# Patient Record
Sex: Male | Born: 1950 | Race: White | Hispanic: No | Smoking: Former smoker
Health system: Southern US, Community
[De-identification: ages and names within clinical notes are randomized; demographics above are authoritative.]

## PROBLEM LIST (undated history)

## (undated) DIAGNOSIS — I509 Heart failure, unspecified: Secondary | ICD-10-CM

## (undated) HISTORY — DX: Heart failure, unspecified: I50.9

---

## 2016-11-24 ENCOUNTER — Encounter: Payer: Self-pay | Admitting: Physician Assistant

## 2016-11-24 ENCOUNTER — Ambulatory Visit: Payer: Self-pay | Admitting: Physician Assistant

## 2016-11-24 VITALS — BP 160/99 | HR 110 | Temp 98.7°F | Resp 16

## 2016-11-24 DIAGNOSIS — L03115 Cellulitis of right lower limb: Secondary | ICD-10-CM

## 2016-11-24 DIAGNOSIS — Z299 Encounter for prophylactic measures, unspecified: Secondary | ICD-10-CM

## 2016-11-24 MED ORDER — CLINDAMYCIN HCL 300 MG PO CAPS: 300.0000 mg | ORAL_CAPSULE | Freq: Three times a day (TID) | ORAL | 0 refills | Status: DC

## 2016-11-24 NOTE — Progress Notes (Signed)
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

## 2016-11-24 NOTE — Addendum Note (Signed)
Addended by: Faythe GheeFISHER, SUSAN W on: 11/24/2016 02:59 PM   Modules accepted: Orders

## 2016-11-24 NOTE — Addendum Note (Signed)
Addended by: Catha BrowEACON, MONIQUE T on: 11/24/2016 03:05 PM   Modules accepted: Orders

## 2016-11-25 LAB — CMP12+LP+TP+TSH+6AC+PSA+CBC…
A/G RATIO: 1.5 (ref 1.2–2.2)
ALBUMIN: 4.7 g/dL (ref 3.6–4.8)
ALT: 13 IU/L (ref 0–44)
AST: 19 IU/L (ref 0–40)
Alkaline Phosphatase: 76 IU/L (ref 39–117)
BASOS: 0 %
BUN/Creatinine Ratio: 12 (ref 10–24)
BUN: 11 mg/dL (ref 8–27)
Basophils Absolute: 0 10*3/uL (ref 0.0–0.2)
Bilirubin Total: 0.4 mg/dL (ref 0.0–1.2)
CALCIUM: 9.4 mg/dL (ref 8.6–10.2)
CHOL/HDL RATIO: 4.3 ratio (ref 0.0–5.0)
CHOLESTEROL TOTAL: 187 mg/dL (ref 100–199)
Chloride: 101 mmol/L (ref 96–106)
Creatinine, Ser: 0.95 mg/dL (ref 0.76–1.27)
EOS (ABSOLUTE): 0.1 10*3/uL (ref 0.0–0.4)
Eos: 1 %
Estimated CHD Risk: 0.8 times avg. (ref 0.0–1.0)
Free Thyroxine Index: 2.1 (ref 1.2–4.9)
GFR calc Af Amer: 95 mL/min/{1.73_m2} (ref >59)
GFR, EST NON AFRICAN AMERICAN: 82 mL/min/{1.73_m2} (ref >59)
GGT: 18 IU/L (ref 0–65)
Globulin, Total: 3.1 g/dL (ref 1.5–4.5)
Glucose: 147 mg/dL — ABNORMAL HIGH (ref 65–99)
HDL: 44 mg/dL (ref >39)
Hematocrit: 44.6 % (ref 37.5–51.0)
Hemoglobin: 15.1 g/dL (ref 13.0–17.7)
IMMATURE GRANULOCYTES: 0 %
IRON: 92 ug/dL (ref 38–169)
Immature Grans (Abs): 0 10*3/uL (ref 0.0–0.1)
LDH: 186 IU/L (ref 121–224)
LDL Calculated: 99 mg/dL (ref 0–99)
LYMPHS ABS: 2.3 10*3/uL (ref 0.7–3.1)
Lymphs: 22 %
MCH: 31.1 pg (ref 26.6–33.0)
MCHC: 33.9 g/dL (ref 31.5–35.7)
MCV: 92 fL (ref 79–97)
MONOS ABS: 0.8 10*3/uL (ref 0.1–0.9)
Monocytes: 7 %
NEUTROS PCT: 70 %
Neutrophils Absolute: 7.4 10*3/uL — ABNORMAL HIGH (ref 1.4–7.0)
PHOSPHORUS: 3.7 mg/dL (ref 2.5–4.5)
POTASSIUM: 5.2 mmol/L (ref 3.5–5.2)
PROSTATE SPECIFIC AG, SERUM: 2.9 ng/mL (ref 0.0–4.0)
Platelets: 269 10*3/uL (ref 150–379)
RBC: 4.86 x10E6/uL (ref 4.14–5.80)
RDW: 12.9 % (ref 12.3–15.4)
SODIUM: 143 mmol/L (ref 134–144)
T3 UPTAKE RATIO: 27 % (ref 24–39)
T4 TOTAL: 7.6 ug/dL (ref 4.5–12.0)
TOTAL PROTEIN: 7.8 g/dL (ref 6.0–8.5)
TRIGLYCERIDES: 221 mg/dL — AB (ref 0–149)
TSH: 0.959 u[IU]/mL (ref 0.450–4.500)
Uric Acid: 6.5 mg/dL (ref 3.7–8.6)
VLDL Cholesterol Cal: 44 mg/dL — ABNORMAL HIGH (ref 5–40)
WBC: 10.6 10*3/uL (ref 3.4–10.8)

## 2016-11-25 LAB — HGB A1C W/O EAG: Hgb A1c MFr Bld: 6.8 % — ABNORMAL HIGH (ref 4.8–5.6)

## 2016-11-26 ENCOUNTER — Encounter: Payer: Self-pay | Admitting: Physician Assistant

## 2016-11-26 ENCOUNTER — Ambulatory Visit: Payer: Self-pay | Admitting: Physician Assistant

## 2016-11-26 VITALS — BP 162/100 | HR 86 | Temp 98.3°F | Ht 70.0 in | Wt 202.0 lb

## 2016-11-26 DIAGNOSIS — L03115 Cellulitis of right lower limb: Secondary | ICD-10-CM

## 2016-11-26 NOTE — Progress Notes (Signed)
S: here for recheck of cellulitis and bp, bp is still elevated, labs drawn the other day show an elevated A1C of 6.8 with high triglycerides, pt states he has not had fever/chills, area is not as red  O: vitals w elevated bp, skin on r foot has decreased redness and has receded inside the marking from 2 days ago, n/v intact  A: cellulitis, prediabetes, elevated bp without dx of htn  P: continue antibiotic, prediabetes packet given with discussion of diet and exercise which would help the A1C and bp; pt wants to try to lower the A1C and bp by diet and exercise at this time, will add cinnamon pills to help naturally lower bp, will return on Tues 7/31 for recheck

## 2016-12-01 ENCOUNTER — Ambulatory Visit: Payer: Self-pay | Admitting: Physician Assistant

## 2016-12-01 ENCOUNTER — Encounter: Payer: Self-pay | Admitting: Physician Assistant

## 2016-12-01 VITALS — BP 170/100 | HR 78 | Temp 97.2°F | Resp 16

## 2016-12-01 DIAGNOSIS — L03115 Cellulitis of right lower limb: Secondary | ICD-10-CM

## 2016-12-01 DIAGNOSIS — I10 Essential (primary) hypertension: Secondary | ICD-10-CM

## 2016-12-01 MED ORDER — CLINDAMYCIN HCL 300 MG PO CAPS: 300.0000 mg | ORAL_CAPSULE | Freq: Three times a day (TID) | ORAL | 0 refills | Status: DC

## 2016-12-01 MED ORDER — LISINOPRIL 20 MG PO TABS: 20.0000 mg | ORAL_TABLET | Freq: Every day | ORAL | 3 refills | Status: DC

## 2016-12-01 NOTE — Progress Notes (Signed)
S: here for recheck of cellulitis, states the foot is a little swollen but feeling much better, no fever/chills, no cp/sob, bp is still elevated, +smoker  O: vitals wnl, nad, r foot is a little swollen. Decreased redness, no pustule or vesicles no drainage, n/v intact  A: htn, cellulitis  P: lisinopril 20mg  qd, clinda 300mg  tid x 5d, recheck bp in 1week

## 2017-05-04 DIAGNOSIS — E119 Type 2 diabetes mellitus without complications: Secondary | ICD-10-CM

## 2017-05-04 DIAGNOSIS — I10 Essential (primary) hypertension: Secondary | ICD-10-CM

## 2017-05-04 HISTORY — DX: Type 2 diabetes mellitus without complications: E11.9

## 2017-05-04 HISTORY — DX: Essential (primary) hypertension: I10

## 2017-12-29 ENCOUNTER — Telehealth: Payer: Self-pay

## 2017-12-29 NOTE — Telephone Encounter (Signed)
I am happy to see him in the clinic but I will not take the place of a primary care provider, as this is an acute care clinic. Chronic conditions and medications, such as hypertension and medications used to treat it, should be monitored by and refills provided by a primary care provider. He is due for blood work to monitor this medication that is supposed to be ordered by a primary care provider but I can order this for him in the meantime if he comes in for a wellness visit with me and I will provide him with two refills following that if appropriate, but future refills and further monitoring need to come from a primary care provider. The purpose of this wellness visit is to address the refill he needs and not to take the place of a wellness exam with a primary care provider. Let me know though if you find out that he is going to run out of his medication prior to that wellness appointment, I want to make sure he doesn't miss any doses. Make sure he knows to go ahead an make an apt with a new primary care provider ASAP to establish care.

## 2017-12-29 NOTE — Telephone Encounter (Signed)
Spoke with patient. Pt states he "has never been to any other doctor", and has not monitored his blood pressure since OV 11/24/16, 11/26/16, and 12/01/16. Pt states "not interested in getting a PCP." I expressed that we want long term medications monitored by a PCP for better monitoring of his health or if there ever needs to be a dosage change. Explained we are an acute care clinic to go along side of a primary care clinic, not take the place of a PCP. Answered all questions. Pt scheduled a visit for 01/04/18 at 11:30am to talk to provider and get resources for a PCP accepting new patients. Pt expressed understanding that we would only do one refill until he schedules that appointment with a PCP.

## 2017-12-29 NOTE — Telephone Encounter (Signed)
Please advise the pharmacy to forward the refill request to the patient's primary care provider. If this patient does not have a primary care provider, advise the patient to make an appointment with one as soon as possible to be seen within the next month and I will provide him with one refill (one month's worth).

## 2017-12-29 NOTE — Telephone Encounter (Signed)
Received fax requesting refill for Lisinopril 20 mg tab.  Last OV- 12/01/2016 Last Lab- 11/24/2016

## 2017-12-30 NOTE — Telephone Encounter (Signed)
Pt. Stated 01/04/18 was the only day he could come since he will be going out of town after that and that he still had a weeks worth of his medication. Pt is aware to go ahead an make an appt with a PCP ASAP to establish care, pt hesitant on doing this. Unsure if he will get PCP due to previous phone call. Pt aware of clinics intention is for acute visits and Biometric screenings and the associated wellness check with it. Pt aware chronic conditions should be done by a PCP. Pt gave verbal understanding.   Last call he stated he would call back about whether he wanted to keep that 01/04/18 appt, and request refill be sent in. I'm assuming he wants the refill now, but he did not specify.

## 2018-01-04 ENCOUNTER — Other Ambulatory Visit: Payer: Self-pay | Admitting: Family Medicine

## 2018-01-04 ENCOUNTER — Ambulatory Visit: Payer: Self-pay

## 2018-01-04 DIAGNOSIS — I10 Essential (primary) hypertension: Secondary | ICD-10-CM

## 2018-01-04 MED ORDER — LISINOPRIL 20 MG PO TABS: 20.0000 mg | ORAL_TABLET | Freq: Every day | ORAL | 0 refills | Status: DC

## 2018-01-04 NOTE — Progress Notes (Signed)
Patient did not show for scheduled appointment for monitoring his hypertension and blood pressure medication.  Providing patient with 1 refill so that he does not run out.  Patient aware that he needs to get blood work done necessary to monitor this medication asap and be seen in our office or by PCP to properly monitor this medication and provide future refills.  Patient aware that he is overdue for a wellness visit with a primary care provider and that he needs to schedule this ASAP for additional blood work outside of monitoring his BP medication.

## 2018-01-07 MED ORDER — LISINOPRIL 20 MG PO TABS: 20.0000 mg | ORAL_TABLET | Freq: Every day | ORAL | 0 refills | Status: DC

## 2018-01-07 NOTE — Progress Notes (Signed)
Redirected refill for lisinopril to a different pharmacy per patient request.

## 2018-02-03 ENCOUNTER — Other Ambulatory Visit: Payer: Self-pay

## 2018-02-03 DIAGNOSIS — I10 Essential (primary) hypertension: Secondary | ICD-10-CM

## 2018-02-03 NOTE — Progress Notes (Signed)
BP 138/90

## 2018-02-04 ENCOUNTER — Other Ambulatory Visit: Payer: Self-pay | Admitting: Family Medicine

## 2018-02-04 DIAGNOSIS — I10 Essential (primary) hypertension: Secondary | ICD-10-CM

## 2018-02-04 LAB — COMPREHENSIVE METABOLIC PANEL
ALBUMIN: 4.7 g/dL (ref 3.6–4.8)
ALK PHOS: 67 IU/L (ref 39–117)
ALT: 29 IU/L (ref 0–44)
AST: 24 IU/L (ref 0–40)
Albumin/Globulin Ratio: 1.7 (ref 1.2–2.2)
BILIRUBIN TOTAL: 0.5 mg/dL (ref 0.0–1.2)
BUN / CREAT RATIO: 11 (ref 10–24)
BUN: 10 mg/dL (ref 8–27)
CHLORIDE: 100 mmol/L (ref 96–106)
CO2: 18 mmol/L — AB (ref 20–29)
CREATININE: 0.89 mg/dL (ref 0.76–1.27)
Calcium: 9.6 mg/dL (ref 8.6–10.2)
GFR calc Af Amer: 101 mL/min/{1.73_m2} (ref >59)
GFR calc non Af Amer: 87 mL/min/{1.73_m2} (ref >59)
GLUCOSE: 143 mg/dL — AB (ref 65–99)
Globulin, Total: 2.7 g/dL (ref 1.5–4.5)
Potassium: 4.4 mmol/L (ref 3.5–5.2)
Sodium: 136 mmol/L (ref 134–144)
TOTAL PROTEIN: 7.4 g/dL (ref 6.0–8.5)

## 2018-02-04 MED ORDER — LISINOPRIL 20 MG PO TABS: 20.0000 mg | ORAL_TABLET | Freq: Every day | ORAL | 0 refills | Status: DC

## 2018-02-04 NOTE — Progress Notes (Signed)
Cole Collins,  Will you call [name] and inform him that his lab results are normal?  Let him know that I will send in one more refill into his pharmacy for his blood pressure medication.  Make sure to remind him to see a primary care provider within the next month to obtain future refills.  His glucose is elevated because he was not fasting.

## 2018-02-04 NOTE — Progress Notes (Signed)
Patient requesting another refill for his lisinopril, since he has been unable to get into see a primary care provider.  Patient unable to see me in the office due to his personal time constraints but was able to stop in for lab work and a blood pressure check.  Blood pressure on 02/03/2018 was 138/90.  Patient denies side/adverse effects, symptoms, or concerns.  CMP normal.  Glucose nonfasting.  Patient reminded that future refills/monitoring are the responsibility of his primary care provider and not our acute care clinic.

## 2018-03-02 ENCOUNTER — Other Ambulatory Visit: Payer: Self-pay | Admitting: Family Medicine

## 2018-03-02 DIAGNOSIS — I10 Essential (primary) hypertension: Secondary | ICD-10-CM

## 2020-04-08 ENCOUNTER — Emergency Department: Payer: Medicare HMO

## 2020-04-08 ENCOUNTER — Inpatient Hospital Stay
Admission: EM | Admit: 2020-04-08 | Discharge: 2020-04-11 | DRG: 291 | Disposition: A | Discharge status: Home / Self Care | Payer: Medicare HMO | Attending: Obstetrics and Gynecology | Admitting: Obstetrics and Gynecology

## 2020-04-08 ENCOUNTER — Other Ambulatory Visit: Payer: Self-pay

## 2020-04-08 DIAGNOSIS — Z7984 Long term (current) use of oral hypoglycemic drugs: Secondary | ICD-10-CM

## 2020-04-08 DIAGNOSIS — I4891 Unspecified atrial fibrillation: Secondary | ICD-10-CM | POA: Diagnosis present

## 2020-04-08 DIAGNOSIS — K5900 Constipation, unspecified: Secondary | ICD-10-CM | POA: Diagnosis present

## 2020-04-08 DIAGNOSIS — R778 Other specified abnormalities of plasma proteins: Secondary | ICD-10-CM | POA: Diagnosis not present

## 2020-04-08 DIAGNOSIS — E876 Hypokalemia: Secondary | ICD-10-CM

## 2020-04-08 DIAGNOSIS — I5041 Acute combined systolic (congestive) and diastolic (congestive) heart failure: Secondary | ICD-10-CM | POA: Diagnosis present

## 2020-04-08 DIAGNOSIS — I5031 Acute diastolic (congestive) heart failure: Secondary | ICD-10-CM | POA: Diagnosis not present

## 2020-04-08 DIAGNOSIS — I509 Heart failure, unspecified: Secondary | ICD-10-CM

## 2020-04-08 DIAGNOSIS — I248 Other forms of acute ischemic heart disease: Secondary | ICD-10-CM | POA: Diagnosis present

## 2020-04-08 DIAGNOSIS — I214 Non-ST elevation (NSTEMI) myocardial infarction: Secondary | ICD-10-CM

## 2020-04-08 DIAGNOSIS — Z20822 Contact with and (suspected) exposure to covid-19: Secondary | ICD-10-CM | POA: Diagnosis present

## 2020-04-08 DIAGNOSIS — Z79899 Other long term (current) drug therapy: Secondary | ICD-10-CM

## 2020-04-08 DIAGNOSIS — Z807 Family history of other malignant neoplasms of lymphoid, hematopoietic and related tissues: Secondary | ICD-10-CM | POA: Diagnosis not present

## 2020-04-08 DIAGNOSIS — E1165 Type 2 diabetes mellitus with hyperglycemia: Secondary | ICD-10-CM | POA: Diagnosis present

## 2020-04-08 DIAGNOSIS — R0602 Shortness of breath: Secondary | ICD-10-CM | POA: Diagnosis present

## 2020-04-08 DIAGNOSIS — F17210 Nicotine dependence, cigarettes, uncomplicated: Secondary | ICD-10-CM | POA: Diagnosis present

## 2020-04-08 DIAGNOSIS — I110 Hypertensive heart disease with heart failure: Principal | ICD-10-CM | POA: Diagnosis present

## 2020-04-08 DIAGNOSIS — I5021 Acute systolic (congestive) heart failure: Secondary | ICD-10-CM | POA: Diagnosis not present

## 2020-04-08 LAB — COMPREHENSIVE METABOLIC PANEL
ALT: 23 U/L (ref 0–44)
AST: 21 U/L (ref 15–41)
Albumin: 3.3 g/dL — ABNORMAL LOW (ref 3.5–5.0)
Alkaline Phosphatase: 61 U/L (ref 38–126)
Anion gap: 13 (ref 5–15)
BUN: 10 mg/dL (ref 8–23)
CO2: 22 mmol/L (ref 22–32)
Calcium: 8.6 mg/dL — ABNORMAL LOW (ref 8.9–10.3)
Chloride: 103 mmol/L (ref 98–111)
Creatinine, Ser: 0.89 mg/dL (ref 0.61–1.24)
GFR, Estimated: >60 mL/min (ref >60)
Glucose, Bld: 211 mg/dL — ABNORMAL HIGH (ref 70–99)
Potassium: 3.4 mmol/L — ABNORMAL LOW (ref 3.5–5.1)
Sodium: 138 mmol/L (ref 135–145)
Total Bilirubin: 0.8 mg/dL (ref 0.3–1.2)
Total Protein: 7.4 g/dL (ref 6.5–8.1)

## 2020-04-08 LAB — CBC
HCT: 41.4 % (ref 39.0–52.0)
Hemoglobin: 13.9 g/dL (ref 13.0–17.0)
MCH: 30.8 pg (ref 26.0–34.0)
MCHC: 33.6 g/dL (ref 30.0–36.0)
MCV: 91.8 fL (ref 80.0–100.0)
Platelets: 386 10*3/uL (ref 150–400)
RBC: 4.51 MIL/uL (ref 4.22–5.81)
RDW: 12.3 % (ref 11.5–15.5)
WBC: 11.3 10*3/uL — ABNORMAL HIGH (ref 4.0–10.5)
nRBC: 0 % (ref 0.0–0.2)

## 2020-04-08 LAB — APTT: aPTT: 36 seconds (ref 24–36)

## 2020-04-08 LAB — RESP PANEL BY RT-PCR (FLU A&B, COVID) ARPGX2
Influenza A by PCR: NEGATIVE
Influenza B by PCR: NEGATIVE
SARS Coronavirus 2 by RT PCR: NEGATIVE

## 2020-04-08 LAB — TROPONIN I (HIGH SENSITIVITY)
Troponin I (High Sensitivity): 126 ng/L (ref <18)
Troponin I (High Sensitivity): 134 ng/L (ref <18)

## 2020-04-08 LAB — PROTIME-INR
INR: 1.1 (ref 0.8–1.2)
Prothrombin Time: 14 seconds (ref 11.4–15.2)

## 2020-04-08 MED ORDER — HEPARIN (PORCINE) 25000 UT/250ML-% IV SOLN
1350.0000 [IU]/h | INTRAVENOUS | Status: DC
  Administered 2020-04-08: 1150 [IU]/h via INTRAVENOUS
  Filled 2020-04-08: qty 250

## 2020-04-08 MED ORDER — HEPARIN SODIUM (PORCINE) 5000 UNIT/ML IJ SOLN
INTRAMUSCULAR | Status: AC
  Filled 2020-04-08: qty 1

## 2020-04-08 MED ORDER — FUROSEMIDE 10 MG/ML IJ SOLN
20.0000 mg | Freq: Once | INTRAMUSCULAR | Status: AC
  Administered 2020-04-08: 20 mg via INTRAVENOUS
  Filled 2020-04-08: qty 4

## 2020-04-08 MED ORDER — HEPARIN BOLUS VIA INFUSION
4000.0000 [IU] | Freq: Once | INTRAVENOUS | Status: AC
  Administered 2020-04-08: 4000 [IU] via INTRAVENOUS
  Filled 2020-04-08: qty 4000

## 2020-04-08 NOTE — ED Triage Notes (Signed)
Pt in with co shob for over a week, saw pmd today and was sent here. States shob on exertion unable to sleep, no hx of the same. Pt denies any pain.

## 2020-04-08 NOTE — ED Notes (Signed)
Admitting MD at bedside.

## 2020-04-08 NOTE — ED Provider Notes (Signed)
[facility] Emergency Department Provider Note  ____________________________________________  Time seen: Approximately 11:26 PM  I have reviewed the triage vital signs and the nursing notes.   HISTORY  Chief Complaint Shortness of Breath    HPI Cole Collins is a 69 y.o. male with a past history of hypertension and diabetes who comes ED complaining of dyspnea on exertion which has been recurrent over the past 2 weeks, gradual onset, worsening, better with rest.  No chest pain.  Associated with orthopnea.  Currently at rest he has mild shortness of breath .     Past medical history hypertension and diabetes   Patient Active Problem List   Diagnosis Date Noted  . Acute CHF (congestive heart failure) (HCC) 04/08/2020        Prior to Admission medications   Medication Sig Start Date End Date Taking? Authorizing Provider  clindamycin (CLEOCIN) 300 MG capsule Take 1 capsule (300 mg total) by mouth 3 (three) times daily. 12/01/16   Fisher, Roselyn Bering, PA-C  lisinopril (PRINIVIL,ZESTRIL) 20 MG tablet Take 1 tablet (20 mg total) by mouth daily. 02/04/18   Ralene Muskrat, FNP     Allergies Patient has no known allergies.   No family history on file.  Social History Social History   Tobacco Use  . Smoking status: Current Every Day Smoker  . Smokeless tobacco: Never Used  Substance Use Topics  . Alcohol use: Not on file  . Drug use: Not on file    Review of Systems  Constitutional:   No fever or chills.  ENT:   No sore throat. No rhinorrhea. Cardiovascular:   No chest pain or syncope. Respiratory: Positive shortness of breath without cough. Gastrointestinal:   Negative for abdominal pain, vomiting and diarrhea.  Musculoskeletal:   Negative for focal pain or swelling All other systems reviewed and are negative except as documented above in ROS and HPI.  ____________________________________________   PHYSICAL EXAM:  VITAL  SIGNS: ED Triage Vitals  Enc Vitals Group     BP 04/08/20 1920 (!) 155/95     Pulse Rate 04/08/20 1920 (!) 116     Resp 04/08/20 1920 20     Temp 04/08/20 2025 97.9 F (36.6 C)     Temp Source 04/08/20 2025 Oral     SpO2 04/08/20 1920 95 %     Weight 04/08/20 1920 209 lb (94.8 kg)     Height 04/08/20 1920 5\' 10"  (1.778 m)     Head Circumference --      Peak Flow --      Pain Score 04/08/20 1920 0     Pain Loc --      Pain Edu? --      Excl. in GC? --     Vital signs reviewed, nursing assessments reviewed.   Constitutional:   Alert and oriented. Non-toxic appearance. Eyes:   Conjunctivae are normal. EOMI. PERRL. ENT      Head:   Normocephalic and atraumatic.      Nose:   Wearing a mask.      Mouth/Throat:   Wearing a mask.      Neck:   No meningismus. Full ROM. Hematological/Lymphatic/Immunilogical:   No cervical lymphadenopathy. Cardiovascular:   Tachycardia heart rate 110. Symmetric bilateral radial and DP pulses.  No murmurs. Cap refill less than 2 seconds. Respiratory:   Normal respiratory effort without tachypnea/retractions.  Bilateral basilar crackles Gastrointestinal:   Soft and nontender. Non distended. There is no CVA tenderness.  No rebound, rigidity, or guarding.  Musculoskeletal:   Normal range of motion in all extremities. No joint effusions.  No lower extremity tenderness.  No edema. Neurologic:   Normal speech and language.  Motor grossly intact. No acute focal neurologic deficits are appreciated.  Skin:    Skin is warm, dry and intact.  Scattered papular rash over bilateral lower legs.  No petechiae, purpura, or bullae.  ____________________________________________    LABS (pertinent positives/negatives) (all labs ordered are listed, but only abnormal results are displayed) Labs Reviewed  CBC - Abnormal; Notable for the following components:      Result Value   WBC 11.3 (*)    All other components within normal limits  COMPREHENSIVE METABOLIC PANEL  - Abnormal; Notable for the following components:   Potassium 3.4 (*)    Glucose, Bld 211 (*)    Calcium 8.6 (*)    Albumin 3.3 (*)    All other components within normal limits  TROPONIN I (HIGH SENSITIVITY) - Abnormal; Notable for the following components:   Troponin I (High Sensitivity) 126 (*)    All other components within normal limits  TROPONIN I (HIGH SENSITIVITY) - Abnormal; Notable for the following components:   Troponin I (High Sensitivity) 134 (*)    All other components within normal limits  RESP PANEL BY RT-PCR (FLU A&B, COVID) ARPGX2  APTT  PROTIME-INR  HEPARIN LEVEL (UNFRACTIONATED)  CBC   ____________________________________________   EKG  Interpreted by me Atrial fibrillation, rate of 117, left axis, normal intervals.  Poor R wave progression.  Normal ST segments and T waves, no acute ischemic changes.  ____________________________________________    RADIOLOGY  DG Chest 2 View  Result Date: 04/08/2020 CLINICAL DATA:  Shortness of breath. EXAM: CHEST - 2 VIEW COMPARISON:  None. FINDINGS: Moderate severity diffusely increased interstitial lung markings are seen with mild prominence of the perihilar pulmonary vasculature. Mild to moderate severity atelectasis and/or infiltrate is seen within the left lung base. Small bilateral pleural effusions are seen. No pneumothorax is identified. The heart size and mediastinal contours are within normal limits. The visualized skeletal structures are unremarkable. IMPRESSION: 1. Moderate severity interstitial edema with mild to moderate severity left basilar atelectasis and/or infiltrate. 2. Small bilateral pleural effusions. Electronically Signed   By: Aram Candela M.D.   On: 04/08/2020 19:52    ____________________________________________   PROCEDURES .Critical Care Performed by: Sharman Cheek, MD Authorized by: Sharman Cheek, MD   Critical care provider statement:    Critical care time (minutes):  35    Critical care time was exclusive of:  Separately billable procedures and treating other patients   Critical care was necessary to treat or prevent imminent or life-threatening deterioration of the following conditions:  Cardiac failure   Critical care was time spent personally by me on the following activities:  Development of treatment plan with patient or surrogate, discussions with consultants, evaluation of patient's response to treatment, examination of patient, obtaining history from patient or surrogate, ordering and performing treatments and interventions, ordering and review of laboratory studies, ordering and review of radiographic studies, pulse oximetry, re-evaluation of patient's condition and review of old charts    ____________________________________________  DIFFERENTIAL DIAGNOSIS   NSTEMI, new onset CHF, pneumonia, pleural effusion, pneumothorax  CLINICAL IMPRESSION / ASSESSMENT AND PLAN / ED COURSE  Medications ordered in the ED: Medications  heparin ADULT infusion 100 units/mL (25000 units/267mL sodium chloride 0.45%) (1,150 Units/hr Intravenous New Bag/Given 04/08/20 2135)  heparin 5000 UNIT/ML injection (  Not Given 04/08/20 2136)  furosemide (LASIX) injection 20 mg (20 mg Intravenous Given 04/08/20 2132)  heparin bolus via infusion 4,000 Units (4,000 Units Intravenous Bolus from Bag 04/08/20 2135)    Pertinent labs & imaging results that were available during my care of the patient were reviewed by me and considered in my medical decision making (see chart for details).  Cole Collins was evaluated in Emergency Department on 04/08/2020 for the symptoms described in the history of present illness. He was evaluated in the context of the global COVID-19 pandemic, which necessitated consideration that the patient might be at risk for infection with the SARS-CoV-2 virus that causes COVID-19. Institutional protocols and algorithms that pertain to the evaluation of patients at  risk for COVID-19 are in a state of rapid change based on information released by regulatory bodies including the CDC and federal and state organizations. These policies and algorithms were followed during the patient's care in the ED.   Patient presents with dyspnea on exertion which is worsening over the past 2 weeks, associate with orthopnea.  Associated with the finding of new onset of atrial fibrillation as well.  Given his exertional symptoms which are worsening, will start on heparin for possible unstable angina.  Initial troponin is elevated at 126.  Will give IV Lasix for initial diuresis and admit for further cardiac work-up.  Patient does not have an established cardiologist.      ____________________________________________   FINAL CLINICAL IMPRESSION(S) / ED DIAGNOSES    Final diagnoses:  NSTEMI (non-ST elevated myocardial infarction) (HCC)  Acute congestive heart failure, unspecified heart failure type [facility])  New onset atrial fibrillation [facility])     ED Discharge Orders    None      Portions of this note were generated with dragon dictation software. Dictation errors may occur despite best attempts at proofreading.   Sharman Cheek, MD 04/08/20 [phone]

## 2020-04-08 NOTE — ED Notes (Signed)
Charge nurse notified of troponin results; acuity level changed

## 2020-04-08 NOTE — H&P (Signed)
[name]   PATIENT NAME: Cole Collins    MR#:  [id number]  DATE OF BIRTH:  01/26/1951  DATE OF ADMISSION:  04/08/2020  PRIMARY CARE PHYSICIAN: Gracelyn Nurse, MD   REQUESTING/REFERRING PHYSICIAN: Alfonse Flavors, MD CHIEF COMPLAINT:   Chief Complaint  Patient presents with  . Shortness of Breath    HISTORY OF PRESENT ILLNESS:  Cole Collins  is a 69 y.o. Caucasian male with a known history of type 2 diabetes mellitus and hypertension as well as tobacco abuse, presented to the emergency room with acute onset of dyspnea with associated orthopnea and paroxysmal nocturnal dyspnea, worsening lower extremity edema as well as dyspnea on exertion which have been worsening over the last 10 days.  He denies any cough or wheezing.  No chest pain or palpitations.  No headache or dizziness or blurred vision.  Upon presentation to the emergency room, blood pressure was 155/95 with a heart rate of 116 with otherwise normal vital signs.  Labs revealed mild hypokalemia of 3.4 and hyperglycemia of 211 and albumin 3.3.  High-sensitivity troponin I was 126 and later 134.  CBC showed no leukocytosis 11.3.  Influenza antigens and COVID-19 PCR came back negative.EKG showed accelerated junctional rhythm with occasional PVCs, left axis deviation and T wave inversion laterally.  Chest x-ray showed moderate severity interstitial edema with mild to moderate severity left basal atelectasis and/or infiltrate with small bilateral pleural effusions.  The patient was given 1 mL of IV Lasix and IV heparin bolus and drip.  He will be admitted to a telemetry bed for further evaluation and management. PAST MEDICAL HISTORY:  Diabetes mellitus, hypertension and tobacco abuse.  PAST SURGICAL HISTORY:  He denies any previous surgeries.  SOCIAL HISTORY:   Social History   Tobacco Use  . Smoking status: Current Every Day Smoker  . Smokeless tobacco: Never Used  Substance Use Topics  . Alcohol use: Not on  file  He quit smoking cigarettes 3 days ago per his report.  FAMILY HISTORY:  Positive for lymphoma in his father.  DRUG ALLERGIES:  No Known Allergies  REVIEW OF SYSTEMS:   ROS As per history of present illness. All pertinent systems were reviewed above. Constitutional, HEENT, cardiovascular, respiratory, GI, GU, musculoskeletal, neuro, psychiatric, endocrine, integumentary and hematologic systems were reviewed and are otherwise negative/unremarkable except for positive findings mentioned above in the HPI.   MEDICATIONS AT HOME:   Prior to Admission medications   Medication Sig Start Date End Date Taking? Authorizing Provider  clindamycin (CLEOCIN) 300 MG capsule Take 1 capsule (300 mg total) by mouth 3 (three) times daily. 12/01/16   Fisher, Roselyn Bering, PA-C  lisinopril (PRINIVIL,ZESTRIL) 20 MG tablet Take 1 tablet (20 mg total) by mouth daily. 02/04/18   McManama, Richardson Dopp, FNP      VITAL SIGNS:  Blood pressure (!) 148/100, pulse (!) 109, temperature 97.9 F (36.6 C), temperature source Oral, resp. rate (!) 36, height 5\' 10"  (1.778 m), weight 94.8 kg, SpO2 94 %.  PHYSICAL EXAMINATION:  Physical Exam  GENERAL:  69 y.o.-year-old Caucasian male patient lying in the bed with mild respiratory distress and conversational dyspnea.  EYES: Pupils equal, round, reactive to light and accommodation. No scleral icterus. Extraocular muscles intact.  HEENT: Head atraumatic, normocephalic. Oropharynx and nasopharynx clear.  NECK:  Supple, no jugular venous distention. No thyroid enlargement, no tenderness.  LUNGS: Diminished bibasal breath sounds with bibasilar rales.62   CARDIOVASCULAR: Regular rate and rhythm, S1, S2 normal.  No murmurs, rubs, or gallops.  ABDOMEN: Soft, nondistended, nontender. Bowel sounds present. No organomegaly or mass.  EXTREMITIES: 1+ bilateral lower extremity pitting edema with no clubbing or cyanosis.  NEUROLOGIC: Cranial nerves II through XII are intact. Muscle  strength 5/5 in all extremities. Sensation intact. Gait not checked.  PSYCHIATRIC: The patient is alert and oriented x 3.  Normal affect and good eye contact. SKIN: No obvious rash, lesion, or ulcer.   LABORATORY PANEL:   CBC Recent Labs  Lab 04/08/20 1926  WBC 11.3*  HGB 13.9  HCT 41.4  PLT 386   ------------------------------------------------------------------------------------------------------------------  Chemistries  Recent Labs  Lab 04/08/20 1926  NA 138  K 3.4*  CL 103  CO2 22  GLUCOSE 211*  BUN 10  CREATININE 0.89  CALCIUM 8.6*  AST 21  ALT 23  ALKPHOS 61  BILITOT 0.8   ------------------------------------------------------------------------------------------------------------------  Cardiac Enzymes No results for input(s): TROPONINI in the last 168 hours. ------------------------------------------------------------------------------------------------------------------  RADIOLOGY:  DG Chest 2 View  Result Date: 04/08/2020 CLINICAL DATA:  Shortness of breath. EXAM: CHEST - 2 VIEW COMPARISON:  None. FINDINGS: Moderate severity diffusely increased interstitial lung markings are seen with mild prominence of the perihilar pulmonary vasculature. Mild to moderate severity atelectasis and/or infiltrate is seen within the left lung base. Small bilateral pleural effusions are seen. No pneumothorax is identified. The heart size and mediastinal contours are within normal limits. The visualized skeletal structures are unremarkable. IMPRESSION: 1. Moderate severity interstitial edema with mild to moderate severity left basilar atelectasis and/or infiltrate. 2. Small bilateral pleural effusions. Electronically Signed   By: Aram Candela M.D.   On: 04/08/2020 19:52      IMPRESSION AND PLAN:   1.  New onset acute CHF likely diastolic. -The patient will be admitted to a progressive unit bed. -She will be diuresed with IV Lasix. -We will follow serial troponin  I's. -2D echo and cardiology consult will be obtained. -I notified Dr. Juliann Pares about the patient.  2.  Hypokalemia. -We will replace potassium and check magnesium level.  3.  Elevated troponin I. -This could be related to demand ischemia and he could be having acute coronary syndrome/non-STEMI. -We will continue him on IV heparin. -Obtain 2D echo and cardiology consult as mentioned above. -The patient will be placed on high-dose statin and aspirin.  He was chest pain-free.  4.  Type 2 diabetes mellitus. -The patient will be placed on supplemental coverage with NovoLog. -We will continue his glipizide and hold off his Metformin.  5.  Essential hypertension. -We will continue has Zestril and HCTZ.  6.  DVT prophylaxis. -The patient will be on IV heparin.  All the records are reviewed and case discussed with ED provider. The plan of care was discussed in details with the patient (and family). I answered all questions. The patient agreed to proceed with the above mentioned plan. Further management will depend upon hospital course.   CODE STATUS: Full code  Status is: Inpatient  Remains inpatient appropriate because:Ongoing diagnostic testing needed not appropriate for outpatient work up, Unsafe d/c plan, IV treatments appropriate due to intensity of illness or inability to take PO and Inpatient level of care appropriate due to severity of illness   Dispo: The patient is from: Home              Anticipated d/c is to: Home              Anticipated d/c date is: 2 days  Patient currently is not medically stable to d/c.   TOTAL TIME TAKING CARE OF THIS PATIENT: 55 minutes.    Hannah Beat M.D on 04/08/2020 at 9:31 PM  [facility]   From 7 PM-7 AM, contact night-coverage [email or web address]  CC: Primary care physician; Gracelyn Nurse, MD

## 2020-04-08 NOTE — Consult Note (Signed)
ANTICOAGULATION CONSULT NOTE - Initial Consult  Pharmacy Consult for Heparin  Indication: ACS / STEMI  No Known Allergies  Patient Measurements: Height: 5\' 10"  (177.8 cm) Weight: 94.8 kg (209 lb) IBW/kg (Calculated) : 73 Heparin Dosing Weight: 94.8 kg   Vital Signs: Temp: 97.9 F (36.6 C) (12/06 2025) Temp Source: Oral (12/06 2025) BP: 148/100 (12/06 2100) Pulse Rate: 109 (12/06 2100)  Labs: Recent Labs    04/08/20 1926  HGB 13.9  HCT 41.4  PLT 386  CREATININE 0.89  TROPONINIHS 126*    Estimated Creatinine Clearance: 88 mL/min (by C-G formula based on SCr of 0.89 mg/dL).   Medical History: No past medical history on file.  Medications:  Confirmed with patient no PTA anticoagulant  Assessment: Pharmacy has been consulted for heparin dosing for ACS / STEMI. Baseline CBC mostly WNL.  Troponin 126  Goal of Therapy:  Heparin level 0.3-0.7 units/ml Monitor platelets by anticoagulation protocol: Yes   Plan:  Baseline labs have been ordered  Heparin DW:  94.8 kg  Give 4000 units bolus x 1 Start heparin infusion at 1150 units/hr Check anti-Xa level in 8 hours and daily while on heparin, per protocol Continue to monitor H&H and platelets daily while on heparin   Cole Collins R Tempest Frankland 04/08/2020,9:24 PM

## 2020-04-09 ENCOUNTER — Encounter: Payer: Self-pay | Admitting: Family Medicine

## 2020-04-09 ENCOUNTER — Inpatient Hospital Stay
Admit: 2020-04-09 | Discharge: 2020-04-09 | Disposition: A | Discharge status: Home / Self Care | Payer: Medicare HMO | Attending: Rehabilitative and Restorative Service Providers" | Admitting: Rehabilitative and Restorative Service Providers"

## 2020-04-09 DIAGNOSIS — I509 Heart failure, unspecified: Secondary | ICD-10-CM

## 2020-04-09 LAB — CBC
HCT: 40.2 % (ref 39.0–52.0)
Hemoglobin: 13.2 g/dL (ref 13.0–17.0)
MCH: 30.8 pg (ref 26.0–34.0)
MCHC: 32.8 g/dL (ref 30.0–36.0)
MCV: 93.9 fL (ref 80.0–100.0)
Platelets: 345 10*3/uL (ref 150–400)
RBC: 4.28 MIL/uL (ref 4.22–5.81)
RDW: 12.5 % (ref 11.5–15.5)
WBC: 10.1 10*3/uL (ref 4.0–10.5)
nRBC: 0 % (ref 0.0–0.2)

## 2020-04-09 LAB — BASIC METABOLIC PANEL
Anion gap: 14 (ref 5–15)
BUN: 11 mg/dL (ref 8–23)
CO2: 24 mmol/L (ref 22–32)
Calcium: 8.8 mg/dL — ABNORMAL LOW (ref 8.9–10.3)
Chloride: 100 mmol/L (ref 98–111)
Creatinine, Ser: 0.86 mg/dL (ref 0.61–1.24)
GFR, Estimated: >60 mL/min (ref >60)
Glucose, Bld: 204 mg/dL — ABNORMAL HIGH (ref 70–99)
Potassium: 3.7 mmol/L (ref 3.5–5.1)
Sodium: 138 mmol/L (ref 135–145)

## 2020-04-09 LAB — HEMOGLOBIN A1C
Hgb A1c MFr Bld: 8.3 % — ABNORMAL HIGH (ref 4.8–5.6)
Mean Plasma Glucose: 191.51 mg/dL

## 2020-04-09 LAB — BRAIN NATRIURETIC PEPTIDE: B Natriuretic Peptide: 1322.7 pg/mL — ABNORMAL HIGH (ref 0.0–100.0)

## 2020-04-09 LAB — TROPONIN I (HIGH SENSITIVITY): Troponin I (High Sensitivity): 130 ng/L (ref <18)

## 2020-04-09 LAB — GLUCOSE, CAPILLARY
Glucose-Capillary: 159 mg/dL — ABNORMAL HIGH (ref 70–99)
Glucose-Capillary: 191 mg/dL — ABNORMAL HIGH (ref 70–99)

## 2020-04-09 LAB — HEPARIN LEVEL (UNFRACTIONATED): Heparin Unfractionated: 0.1 IU/mL — ABNORMAL LOW (ref 0.30–0.70)

## 2020-04-09 MED ORDER — ENOXAPARIN SODIUM 40 MG/0.4ML ~~LOC~~ SOLN
40.0000 mg | SUBCUTANEOUS | Status: DC
  Administered 2020-04-09 – 2020-04-10 (×2): 40 mg via SUBCUTANEOUS
  Filled 2020-04-09 (×2): qty 0.4

## 2020-04-09 MED ORDER — INSULIN ASPART 100 UNIT/ML ~~LOC~~ SOLN: 0.0000 [IU] | Freq: Every day | SUBCUTANEOUS | Status: DC

## 2020-04-09 MED ORDER — INSULIN ASPART 100 UNIT/ML ~~LOC~~ SOLN
0.0000 [IU] | Freq: Three times a day (TID) | SUBCUTANEOUS | Status: DC
  Administered 2020-04-09: 3 [IU] via SUBCUTANEOUS
  Administered 2020-04-10: 2 [IU] via SUBCUTANEOUS
  Administered 2020-04-10: 3 [IU] via SUBCUTANEOUS
  Administered 2020-04-10: 5 [IU] via SUBCUTANEOUS
  Administered 2020-04-11: 3 [IU] via SUBCUTANEOUS
  Filled 2020-04-09 (×5): qty 1

## 2020-04-09 MED ORDER — PERFLUTREN LIPID MICROSPHERE
1.0000 mL | INTRAVENOUS | Status: AC | PRN
  Administered 2020-04-09: 2 mL via INTRAVENOUS
  Filled 2020-04-09: qty 10

## 2020-04-09 MED ORDER — LISINOPRIL 20 MG PO TABS
20.0000 mg | ORAL_TABLET | Freq: Every day | ORAL | Status: DC
  Administered 2020-04-09 – 2020-04-11 (×3): 20 mg via ORAL
  Filled 2020-04-09 (×3): qty 1

## 2020-04-09 MED ORDER — HEPARIN BOLUS VIA INFUSION
2000.0000 [IU] | Freq: Once | INTRAVENOUS | Status: AC
  Administered 2020-04-09: 2000 [IU] via INTRAVENOUS
  Filled 2020-04-09: qty 2000

## 2020-04-09 MED ORDER — HYDROCHLOROTHIAZIDE 25 MG PO TABS
25.0000 mg | ORAL_TABLET | Freq: Every day | ORAL | Status: DC
  Administered 2020-04-09 – 2020-04-11 (×3): 25 mg via ORAL
  Filled 2020-04-09 (×3): qty 1

## 2020-04-09 MED ORDER — FUROSEMIDE 10 MG/ML IJ SOLN
20.0000 mg | Freq: Two times a day (BID) | INTRAMUSCULAR | Status: DC
  Administered 2020-04-09 – 2020-04-10 (×3): 20 mg via INTRAVENOUS
  Filled 2020-04-09 (×3): qty 2

## 2020-04-09 NOTE — Consult Note (Signed)
   Heart Failure Nurse Navigator Note  Echocardiogram is pending at this time..  Presented to the emergency room with complaints of pain dyspnea on exertion, lower extremity edema, orthopnea and PND. He states approximately 10 days ago 4:00 in the morning he had awoken with terrible epigastric discomfort and went to sit on the commode and broke out into profuse diaphoresis. He states ever since that time that he is gradually gotten more short of breath, more fatigue and more edema. States that he had gotten to feeling so poorly that he had not taken his meds during this time.  Co morbidities:  Type 2 diabetes Hypertension Continued tobacco abuse  Indications: Lasix 20 mg IV twice a day Hydrochlorothiazide 25 mg daily Lisinopril 20 mg daily   Labs:  Sodium 138, potassium 3.7, chloride 100, CO2 24, BUN 11, creatinine 0.86, troponin I 130, BNP 1322,  Intake 100 mL Output 700 mL Red clip reading 46 Weight 91.9 kg BMI 29 Blood pressure 139/94     Assessment:  General -he is awake and alert sitting up in the bed in no acute distress.  HEENT pupils are equal, normocephalic,  Cardiac-heart tones of regular rate and rhythm no murmur or gallops appreciated.   Chest-breath sounds are clear to posterior auscultation.  Abdomen rounded soft nontender.   Musculoskeletal-lower extremity edema 1+  Neurologic-speech is clear moves all extremities without difficulty   Psych- is pleasant and appropriate, makes good eye contact.    Discussed heart failure with the patient. He states that he had viewed heart failure video. Discussed  the importance of limiting  his sodium intake to 2000 mg a day. He states that this is going to be difficult because eats two meals a day from a restaurant. Suggested he ask the servers if when they prepare his food not to season.  Loves to drink tomato juice, he states he has been known to be traveling down the road and drinking tomato juice and  snacking on potato chips.   Talked about the importance of weighing daily voiding and reporting to physician 2 to 3 pound weight gain overnight or 5 pounds within a week.  Went over at his zone magnet and re- inforced things to report to physician.  He was also given heart failure teaching booklet and holiday eating handout.   Tresa Endo RN, CHFN

## 2020-04-09 NOTE — Progress Notes (Signed)
*  PRELIMINARY RESULTS* Echocardiogram 2D Echocardiogram has been performed.  Cole Collins Cole Collins 04/09/2020, 3:38 PM

## 2020-04-09 NOTE — Consult Note (Signed)
CARDIOLOGY CONSULT NOTE               Patient ID: Cole Collins MRN: [id number] DOB/AGE: 1950/06/12 69 y.o.  Admit date: 04/08/2020 Referring Physician Dr. Valente David  Primary Physician Dr. Marcelino Duster  Primary Cardiologist N/A Reason for Consultation New onset CHF  HPI: Cole Collins is a 69 year male with a past medical history significant for type 2 diabetes, hypertension, and tobacco abuse who presented to the ED on 04/08/20 for a 1-2 week history of worsening exertional dyspnea, lower extremity swelling, orthopnea, and PND. Workup in the ED was significant for WBC of 11.3, potassium of 3.4, high sensitivity troponin elevated x 2, 126 and 134 respectively, COVID-19 negative, chest xray revealing moderate interstitial edema with mild to moderate left basilar atelectasis/infiltrate, and small bilateral pleural effusions, and ECG revealing sinus tachycardia.    04/09/20: Cole Collins is sitting up in bed, in no acute distress.  He reports shortness of breath and lower extremity swelling have mildly improved since admission. He continues to deny chest pain or chest pressure.  He denies dizziness, lightheadedness, or syncopal/presyncopal episodes.  He lives alone and reports eating 2 meals out a day.   Review of systems complete and found to be negative unless listed above     Past Medical History:  Diagnosis Date  . Diabetes (HCC) 2019  . Hypertension 2019    History reviewed. No pertinent surgical history.  Medications Prior to Admission  Medication Sig Dispense Refill Last Dose  . glipiZIDE (GLUCOTROL) 5 MG tablet Take 5 mg by mouth daily.   Past Month at Unknown time  . hydrochlorothiazide (HYDRODIURIL) 25 MG tablet Take 25 mg by mouth daily.   Past Week at Unknown time  . lisinopril (PRINIVIL,ZESTRIL) 20 MG tablet Take 1 tablet (20 mg total) by mouth daily. 30 tablet 0 04/07/2020 at 2100  . metFORMIN (GLUCOPHAGE) 500 MG tablet Take 500 mg by mouth daily.   Past Week at Unknown  time  . clindamycin (CLEOCIN) 300 MG capsule Take 1 capsule (300 mg total) by mouth 3 (three) times daily. (Patient not taking: Reported on 04/09/2020) 15 capsule 0 Completed Course at Unknown time   Social History   Socioeconomic History  . Marital status: Widowed    Spouse name: Not on file  . Number of children: 2  . Years of education: Not on file  . Highest education level: Not on file  Occupational History  . Occupation: Architect  Tobacco Use  . Smoking status: Former Smoker    Packs/day: 0.50    Years: 50.00    Pack years: 25.00    Types: Cigarettes    Quit date: 04/06/2020  . Smokeless tobacco: Never Used  Vaping Use  . Vaping Use: Never used  Substance and Sexual Activity  . Alcohol use: Not Currently  . Drug use: Never  . Sexual activity: Not on file  Other Topics Concern  . Not on file  Social History Narrative  . Not on file   Social Determinants of Health   Financial Resource Strain:   . Difficulty of Paying Living Expenses: Not on file  Food Insecurity:   . Worried About Programme researcher, broadcasting/film/video in the Last Year: Not on file  . Ran Out of Food in the Last Year: Not on file  Transportation Needs:   . Lack of Transportation (Medical): Not on file  . Lack of Transportation (Non-Medical): Not on file  Physical Activity:   .  Days of Exercise per Week: Not on file  . Minutes of Exercise per Session: Not on file  Stress:   . Feeling of Stress : Not on file  Social Connections:   . Frequency of Communication with Friends and Family: Not on file  . Frequency of Social Gatherings with Friends and Family: Not on file  . Attends Religious Services: Not on file  . Active Member of Clubs or Organizations: Not on file  . Attends Banker Meetings: Not on file  . Marital Status: Not on file  Intimate Partner Violence:   . Fear of Current or Ex-Partner: Not on file  . Emotionally Abused: Not on file  . Physically Abused: Not on file  . Sexually  Abused: Not on file    Family History  Problem Relation Age of Onset  . Dementia Mother   . Lymphoma Father       Review of systems complete and found to be negative unless listed above      PHYSICAL EXAM  General: Well developed, well nourished, in no acute distress HEENT:  Normocephalic and atramatic Neck:  No JVD.  Lungs: On supplemental O2. Expiratory crackles in lower lung fields bilaterally Heart: HRRR . Normal S1 and S2 without gallops or murmurs.  Abdomen: Bowel sounds are positive, abdomen soft and non-tender  Msk:  Back normal.  Normal strength and tone for age. Extremities: No clubbing or cyanosis.  Mild peripheral edema in bilateral lower extremities   Neuro: Alert and oriented X 3. Psych:  Good affect, responds appropriately  Labs:   Lab Results  Component Value Date   WBC 10.1 04/09/2020   HGB 13.2 04/09/2020   HCT 40.2 04/09/2020   MCV 93.9 04/09/2020   PLT 345 04/09/2020    Recent Labs  Lab 04/08/20 1926  NA 138  K 3.4*  CL 103  CO2 22  BUN 10  CREATININE 0.89  CALCIUM 8.6*  PROT 7.4  BILITOT 0.8  ALKPHOS 61  ALT 23  AST 21  GLUCOSE 211*   No results found for: CKTOTAL, CKMB, CKMBINDEX, TROPONINI  Lab Results  Component Value Date   CHOL 187 11/24/2016   Lab Results  Component Value Date   HDL 44 11/24/2016   Lab Results  Component Value Date   LDLCALC 99 11/24/2016   Lab Results  Component Value Date   TRIG 221 (H) 11/24/2016   Lab Results  Component Value Date   CHOLHDL 4.3 11/24/2016   No results found for: LDLDIRECT    Radiology: DG Chest 2 View  Result Date: 04/08/2020 CLINICAL DATA:  Shortness of breath. EXAM: CHEST - 2 VIEW COMPARISON:  None. FINDINGS: Moderate severity diffusely increased interstitial lung markings are seen with mild prominence of the perihilar pulmonary vasculature. Mild to moderate severity atelectasis and/or infiltrate is seen within the left lung base. Small bilateral pleural effusions are  seen. No pneumothorax is identified. The heart size and mediastinal contours are within normal limits. The visualized skeletal structures are unremarkable. IMPRESSION: 1. Moderate severity interstitial edema with mild to moderate severity left basilar atelectasis and/or infiltrate. 2. Small bilateral pleural effusions. Electronically Signed   By: Aram Candela M.D.   On: 04/08/2020 19:52    EKG: Sinus tachycardia; baseline artifact limits interpretation   ASSESSMENT AND PLAN:  1.  Acute CHF   -Has received Lasix 20mg  injection x 1; will start IV Lasix 20mg  BID   -Daily weights, I's and O's recommended   -Discussed the  importance of a low sodium diet, daily weights upon discharge   -Echocardiogram ordered   2.  Elevated troponin   -Borderline elevated but flat, 126 and 134 respectively; 3rd troponin pending - will discontinue heparin   -In the absence of chest pain, likely demand ischemia from new onset CHF   -Echocardiogram ordered   3.  Hypertension   -Continue HCTZ 25mg  daily and lisinopril 20mg  daily   4.  Hypokalemia   -Continue to monitor and replete as warranted   The history, physical exam findings, and plan of care were all discussed with Dr. , and all decision making was made in collaboration.   Signed: PA-C 04/09/2020, 7:46 AM

## 2020-04-09 NOTE — Plan of Care (Signed)

## 2020-04-09 NOTE — Progress Notes (Signed)
PROGRESS NOTE    Cole Collins  IEP:[id number] DOB: 06-Jul-1950 DOA: 04/08/2020 PCP: Cole Nurse, MD   Brief Narrative: Taken from H&P. Cole Collins  is a 69 y.o. Caucasian male with a known history of type 2 diabetes mellitus and hypertension as well as tobacco abuse, presented to the emergency room with acute onset of dyspnea with associated orthopnea and paroxysmal nocturnal dyspnea, worsening lower extremity edema as well as dyspnea on exertion which have been worsening over the last 10 days. Elevated BNP at 1322 and mildly elevated troponin most likely secondary to demand ischemia.  Patient was started on IV diuresis.  Cardiology was consulted.  Initially received heparin infusion which was discontinued later as there was no chest pain and increasing troponin was most likely secondary to demand ischemia. Echocardiogram pending  Subjective: Patient continued to feel some shortness of breath, stating that it is much better than before.  Assessment & Plan:   Active Problems:   Acute CHF (congestive heart failure) (HCC)  New onset acute CHF.  Troponin mildly elevated most likely secondary to demand.  No ACS per cardiology.  Heparin infusion was discontinued. Symptoms with some improvement with IV diuresis. Echocardiogram pending. Cardiology was consulted-seen by Dr. Juliann Pares. -Continue with IV diuresis -Continue with daily weight and BMP. -Strict intake and output  Hypokalemia.  Resolved. -Monitor electrolyte, replete as needed.  Type 2 diabetes mellitus.  Patient was on glipizide and Metformin at home. -Continue with SSI  Essential hypertension.  Blood pressure mildly elevated. -Continue with home dose of Zestril and HCTZ. -Patient is on Lasix.  Objective: Vitals:   04/09/20 0219 04/09/20 0322 04/09/20 0724 04/09/20 1117  BP: (!) 140/100 (!) 143/99 (!) 154/99 (!) 139/94  Pulse: (!) 107 (!) 107 (!) 101 (!) 110  Resp: (!) 23 16 18 17   Temp:  98.4 F (36.9 C) 97.7  F (36.5 C) 97.8 F (36.6 C)  TempSrc:  Oral Oral Oral  SpO2: 95% 98% 95% 98%  Weight:  91.9 kg    Height:        Intake/Output Summary (Last 24 hours) at 04/09/2020 1507 Last data filed at 04/09/2020 1116 Gross per 24 hour  Intake 100.12 ml  Output 1925 ml  Net -1824.88 ml   Filed Weights   04/08/20 1920 04/09/20 0322  Weight: 94.8 kg 91.9 kg    Examination:  General exam: Appears calm and comfortable  Respiratory system: Clear to auscultation. Respiratory effort normal. Cardiovascular system: S1 & S2 heard, RRR. Gastrointestinal system: Soft, nontender, nondistended, bowel sounds positive. Central nervous system: Alert and oriented. No focal neurological deficits. Extremities: 2+ LE edema, no cyanosis, pulses intact and symmetrical. Psychiatry: Judgement and insight appear normal. Mood & affect appropriate.    DVT prophylaxis: Lovenox Code Status:  Family Communication: Discussed with patient Disposition Plan:  Status is: Inpatient  Remains inpatient appropriate because:Inpatient level of care appropriate due to severity of illness   Dispo: The patient is from: Home              Anticipated d/c is to: Home              Anticipated d/c date is: 1 day              Patient currently is not medically stable to d/c.   Consultants:   Cardiology  Procedures:  Antimicrobials:   Data Reviewed: I have personally reviewed following labs and imaging studies  CBC: Recent Labs  Lab 04/08/20 1926 04/09/20 0431  WBC 11.3* 10.1  HGB 13.9 13.2  HCT 41.4 40.2  MCV 91.8 93.9  PLT 386 345   Basic Metabolic Panel: Recent Labs  Lab 04/08/20 1926 04/09/20 0845  NA 138 138  K 3.4* 3.7  CL 103 100  CO2 22 24  GLUCOSE 211* 204*  BUN 10 11  CREATININE 0.89 0.86  CALCIUM 8.6* 8.8*   GFR: Estimated Creatinine Clearance: 89.8 mL/min (by C-G formula based on SCr of 0.86 mg/dL). Liver Function Tests: Recent Labs  Lab 04/08/20 1926  AST 21  ALT 23  ALKPHOS 61   BILITOT 0.8  PROT 7.4  ALBUMIN 3.3*   No results for input(s): LIPASE, AMYLASE in the last 168 hours. No results for input(s): AMMONIA in the last 168 hours. Coagulation Profile: Recent Labs  Lab 04/08/20 2137  INR 1.1   Cardiac Enzymes: No results for input(s): CKTOTAL, CKMB, CKMBINDEX, TROPONINI in the last 168 hours. BNP (last 3 results) No results for input(s): PROBNP in the last 8760 hours. HbA1C: No results for input(s): HGBA1C in the last 72 hours. CBG: No results for input(s): GLUCAP in the last 168 hours. Lipid Profile: No results for input(s): CHOL, HDL, LDLCALC, TRIG, CHOLHDL, LDLDIRECT in the last 72 hours. Thyroid Function Tests: No results for input(s): TSH, T4TOTAL, FREET4, T3FREE, THYROIDAB in the last 72 hours. Anemia Panel: No results for input(s): VITAMINB12, FOLATE, FERRITIN, TIBC, IRON, RETICCTPCT in the last 72 hours. Sepsis Labs: No results for input(s): PROCALCITON, LATICACIDVEN in the last 168 hours.  Recent Results (from the past 240 hour(s))  Resp Panel by RT-PCR (Flu A&B, Covid) Nasopharyngeal Swab     Status: None   Collection Time: 04/08/20  7:26 PM   Specimen: Nasopharyngeal Swab; Nasopharyngeal(NP) swabs in vial transport medium  Result Value Ref Range Status   SARS Coronavirus 2 by RT PCR NEGATIVE NEGATIVE Final    Comment: (NOTE) SARS-CoV-2 target nucleic acids are NOT DETECTED.  The SARS-CoV-2 RNA is generally detectable in upper respiratory specimens during the acute phase of infection. The lowest concentration of SARS-CoV-2 viral copies this assay can detect is 138 copies/mL. A negative result does not preclude SARS-Cov-2 infection and should not be used as the sole basis for treatment or other patient management decisions. A negative result may occur with  improper specimen collection/handling, submission of specimen other than nasopharyngeal swab, presence of viral mutation(s) within the areas targeted by this assay, and  inadequate number of viral copies(<138 copies/mL). A negative result must be combined with clinical observations, patient history, and epidemiological information. The expected result is Negative.  Fact Sheet for Patients:  BloggerCourse.com  Fact Sheet for Healthcare Providers:  SeriousBroker.it  This test is no t yet approved or cleared by the [place] FDA and  has been authorized for detection and/or diagnosis of SARS-CoV-2 by FDA under an Emergency Use Authorization (EUA). This EUA will remain  in effect (meaning this test can be used) for the duration of the COVID-19 declaration under Section 564(b)(1) of the Act, 21 U.S.C.section 360bbb-3(b)(1), unless the authorization is terminated  or revoked sooner.       Influenza A by PCR NEGATIVE NEGATIVE Final   Influenza B by PCR NEGATIVE NEGATIVE Final    Comment: (NOTE) The Xpert Xpress SARS-CoV-2/FLU/RSV plus assay is intended as an aid in the diagnosis of influenza from Nasopharyngeal swab specimens and should not be used as a sole basis for treatment. Nasal washings and aspirates are unacceptable for Xpert Xpress SARS-CoV-2/FLU/RSV testing.  Fact  Sheet for Patients: BloggerCourse.com  Fact Sheet for Healthcare Providers: SeriousBroker.it  This test is not yet approved or cleared by the [place] FDA and has been authorized for detection and/or diagnosis of SARS-CoV-2 by FDA under an Emergency Use Authorization (EUA). This EUA will remain in effect (meaning this test can be used) for the duration of the COVID-19 declaration under Section 564(b)(1) of the Act, 21 U.S.C. section 360bbb-3(b)(1), unless the authorization is terminated or revoked.  Performed at [facility], [address] [place], [place] [address]      Radiology Studies: DG Chest 2 View  Result Date: 04/08/2020 CLINICAL DATA:   Shortness of breath. EXAM: CHEST - 2 VIEW COMPARISON:  None. FINDINGS: Moderate severity diffusely increased interstitial lung markings are seen with mild prominence of the perihilar pulmonary vasculature. Mild to moderate severity atelectasis and/or infiltrate is seen within the left lung base. Small bilateral pleural effusions are seen. No pneumothorax is identified. The heart size and mediastinal contours are within normal limits. The visualized skeletal structures are unremarkable. IMPRESSION: 1. Moderate severity interstitial edema with mild to moderate severity left basilar atelectasis and/or infiltrate. 2. Small bilateral pleural effusions. Electronically Signed   By: Aram Candela M.D.   On: 04/08/2020 19:52    Scheduled Meds: . furosemide  20 mg Intravenous BID  . hydrochlorothiazide  25 mg Oral Daily  . insulin aspart  0-15 Units Subcutaneous TID WC  . insulin aspart  0-5 Units Subcutaneous QHS  . lisinopril  20 mg Oral Daily   Continuous Infusions:   LOS: 1 day   Time spent: 35 minutes.  Arnetha Courser, MD [facility]  If 7PM-7AM, please contact night-coverage [email or web address]  04/09/2020, 3:07 PM   This record has been created using Conservation officer, historic buildings. Errors have been sought and corrected,but may not always be located. Such creation errors do not reflect on the standard of care.

## 2020-04-09 NOTE — Consult Note (Signed)
ANTICOAGULATION CONSULT NOTE - Initial Consult  Pharmacy Consult for Heparin  Indication: ACS / STEMI  No Known Allergies  Patient Measurements: Height: 5\' 10"  (177.8 cm) Weight: 91.9 kg (202 lb 11.2 oz) IBW/kg (Calculated) : 73 Heparin Dosing Weight: 94.8 kg   Vital Signs: Temp: 98.4 F (36.9 C) (12/07 0322) Temp Source: Oral (12/07 0322) BP: 143/99 (12/07 0322) Pulse Rate: 107 (12/07 0322)  Labs: Recent Labs    04/08/20 1926 04/08/20 2119 04/08/20 2137 04/09/20 0431 04/09/20 0530  HGB 13.9  --   --  13.2  --   HCT 41.4  --   --  40.2  --   PLT 386  --   --  345  --   APTT  --   --  36  --   --   LABPROT  --   --  14.0  --   --   INR  --   --  1.1  --   --   HEPARINUNFRC  --   --   --   --  0.10*  CREATININE 0.89  --   --   --   --   TROPONINIHS 126* 134*  --   --   --     Estimated Creatinine Clearance: 86.8 mL/min (by C-G formula based on SCr of 0.89 mg/dL).   Medical History: Past Medical History:  Diagnosis Date  . Diabetes (HCC) 2019  . Hypertension 2019    Medications:  Confirmed with patient no PTA anticoagulant  Assessment: Pharmacy has been consulted for heparin dosing for ACS / STEMI. Baseline CBC mostly WNL.  Troponin 126  Goal of Therapy:  Heparin level 0.3-0.7 units/ml Monitor platelets by anticoagulation protocol: Yes   Plan:  Baseline labs have been ordered  Heparin DW:  94.8 kg  Give 4000 units bolus x 1 Start heparin infusion at 1150 units/hr Check anti-Xa level in 8 hours and daily while on heparin, per protocol Continue to monitor H&H and platelets daily while on heparin  1207 @ 0530 HL 0.10, SUBtherapeutic.  Will rebolus w/ 2000 units x 1 and increase Heparin rate to 1350 units/hr.  Recheck HL in 8 hours  1208, Tiani Stanbery A 04/09/2020,6:53 AM

## 2020-04-10 DIAGNOSIS — I5021 Acute systolic (congestive) heart failure: Secondary | ICD-10-CM

## 2020-04-10 LAB — ECHOCARDIOGRAM COMPLETE
AR max vel: 1.96 cm2
AV Area VTI: 1.92 cm2
AV Area mean vel: 1.87 cm2
AV Mean grad: 4 mmHg
AV Peak grad: 7.7 mmHg
Ao pk vel: 1.39 m/s
Area-P 1/2: 10.39 cm2
Calc EF: 22.8 %
Height: 70 in
P 1/2 time: 486 msec
S' Lateral: 3.92 cm
Single Plane A2C EF: 11.8 %
Single Plane A4C EF: 31.5 %
Weight: 3243.2 oz

## 2020-04-10 LAB — BASIC METABOLIC PANEL
Anion gap: 13 (ref 5–15)
BUN: 13 mg/dL (ref 8–23)
CO2: 25 mmol/L (ref 22–32)
Calcium: 9.1 mg/dL (ref 8.9–10.3)
Chloride: 99 mmol/L (ref 98–111)
Creatinine, Ser: 0.96 mg/dL (ref 0.61–1.24)
GFR, Estimated: >60 mL/min (ref >60)
Glucose, Bld: 191 mg/dL — ABNORMAL HIGH (ref 70–99)
Potassium: 3.5 mmol/L (ref 3.5–5.1)
Sodium: 137 mmol/L (ref 135–145)

## 2020-04-10 LAB — GLUCOSE, CAPILLARY
Glucose-Capillary: 187 mg/dL — ABNORMAL HIGH (ref 70–99)
Glucose-Capillary: 146 mg/dL — ABNORMAL HIGH (ref 70–99)
Glucose-Capillary: 237 mg/dL — ABNORMAL HIGH (ref 70–99)
Glucose-Capillary: 146 mg/dL — ABNORMAL HIGH (ref 70–99)

## 2020-04-10 LAB — TSH: TSH: 1.115 u[IU]/mL (ref 0.350–4.500)

## 2020-04-10 MED ORDER — FUROSEMIDE 20 MG PO TABS
20.0000 mg | ORAL_TABLET | Freq: Every day | ORAL | Status: DC
  Administered 2020-04-10 – 2020-04-11 (×2): 20 mg via ORAL
  Filled 2020-04-10 (×2): qty 1

## 2020-04-10 MED ORDER — CANAGLIFLOZIN 100 MG PO TABS
100.0000 mg | ORAL_TABLET | Freq: Every day | ORAL | Status: DC
  Administered 2020-04-11: 100 mg via ORAL
  Filled 2020-04-10: qty 1

## 2020-04-10 MED ORDER — CARVEDILOL 6.25 MG PO TABS
6.2500 mg | ORAL_TABLET | Freq: Two times a day (BID) | ORAL | Status: DC
  Administered 2020-04-10 – 2020-04-11 (×2): 6.25 mg via ORAL
  Filled 2020-04-10 (×2): qty 1

## 2020-04-10 MED ORDER — DOCUSATE SODIUM 100 MG PO CAPS
100.0000 mg | ORAL_CAPSULE | Freq: Every day | ORAL | Status: DC
  Administered 2020-04-10 – 2020-04-11 (×2): 100 mg via ORAL
  Filled 2020-04-10 (×2): qty 1

## 2020-04-10 MED ORDER — POTASSIUM CHLORIDE CRYS ER 20 MEQ PO TBCR
20.0000 meq | EXTENDED_RELEASE_TABLET | Freq: Every day | ORAL | Status: DC
  Administered 2020-04-10 – 2020-04-11 (×2): 20 meq via ORAL
  Filled 2020-04-10 (×2): qty 1

## 2020-04-10 NOTE — Progress Notes (Signed)
[facility] Cardiology    SUBJECTIVE: Cole Collins is a 69 year male with a past medical history significant for type 2 diabetes, hypertension, and tobacco abuse who presented to the ED on 04/08/20 for a 1-2 week history of worsening exertional dyspnea, lower extremity swelling, orthopnea, and PND. Workup in the ED was significant for WBC of 11.3, potassium of 3.4, high sensitivity troponin elevated x 3, 126, 134, and 130 respectively, BNP of 1322, COVID-19 negative, chest xray revealing moderate interstitial edema with mild to moderate left basilar atelectasis/infiltrate, and small bilateral pleural effusions, and ECG revealing sinus tachycardia.    04/09/20: Cole Collins is sitting up in bed, in no acute distress.  He reports shortness of breath and lower extremity swelling have mildly improved since admission. He continues to deny chest pain or chest pressure.  He denies dizziness, lightheadedness, or syncopal/presyncopal episodes.  He lives alone and reports eating 2 meals out a day.   04/10/20: Reports significant improvement in shortness of breath, orthopnea, PND, and lower extremity swelling.  Ambulated around the floor this morning and admitted to only mild dyspnea.  Continues to deny chest pain or chest pressure.     Vitals:   04/09/20 2014 04/09/20 2149 04/10/20 0309 04/10/20 0442  BP: (!) 121/96 (!) 130/93  (!) 131/93  Pulse: (!) 113 (!) 112  100  Resp:  18    Temp: 98.7 F (37.1 C) 97.7 F (36.5 C)  97.6 F (36.4 C)  TempSrc: Oral Oral  Oral  SpO2: 94% 95%  95%  Weight:   90.2 kg   Height:         Intake/Output Summary (Last 24 hours) at 04/10/2020 6433 Last data filed at 04/10/2020 2951 Gross per 24 hour  Intake 240 ml  Output 1425 ml  Net -1185 ml      PHYSICAL EXAM  General: Well developed, well nourished, in no acute distress HEENT:  Normocephalic and atramatic Neck:  No JVD.  Lungs: Clear bilaterally to auscultation and percussion. Heart: HRRR . Normal S1 and S2 without  gallops or murmurs.  Abdomen: Bowel sounds are positive, abdomen soft and non-tender  Msk:  Back normal.  Normal strength and tone for age. Extremities: No clubbing, cyanosis or edema.   Neuro: Alert and oriented X 3. Psych:  Good affect, responds appropriately   LABS: Basic Metabolic Panel: Recent Labs    04/09/20 0845 04/10/20 0548  NA 138 137  K 3.7 3.5  CL 100 99  CO2 24 25  GLUCOSE 204* 191*  BUN 11 13  CREATININE 0.86 0.96  CALCIUM 8.8* 9.1   Liver Function Tests: Recent Labs    04/08/20 1926  AST 21  ALT 23  ALKPHOS 61  BILITOT 0.8  PROT 7.4  ALBUMIN 3.3*   No results for input(s): LIPASE, AMYLASE in the last 72 hours. CBC: Recent Labs    04/08/20 1926 04/09/20 0431  WBC 11.3* 10.1  HGB 13.9 13.2  HCT 41.4 40.2  MCV 91.8 93.9  PLT 386 345   Cardiac Enzymes: No results for input(s): CKTOTAL, CKMB, CKMBINDEX, TROPONINI in the last 72 hours. BNP: Invalid input(s): POCBNP D-Dimer: No results for input(s): DDIMER in the last 72 hours. Hemoglobin A1C: Recent Labs    04/08/20 1926  HGBA1C 8.3*   Fasting Lipid Panel: No results for input(s): CHOL, HDL, LDLCALC, TRIG, CHOLHDL, LDLDIRECT in the last 72 hours. Thyroid Function Tests: No results for input(s): TSH, T4TOTAL, T3FREE, THYROIDAB in the last 72 hours.  Invalid  input(s): FREET3 Anemia Panel: No results for input(s): VITAMINB12, FOLATE, FERRITIN, TIBC, IRON, RETICCTPCT in the last 72 hours.  DG Chest 2 View  Result Date: 04/08/2020 CLINICAL DATA:  Shortness of breath. EXAM: CHEST - 2 VIEW COMPARISON:  None. FINDINGS: Moderate severity diffusely increased interstitial lung markings are seen with mild prominence of the perihilar pulmonary vasculature. Mild to moderate severity atelectasis and/or infiltrate is seen within the left lung base. Small bilateral pleural effusions are seen. No pneumothorax is identified. The heart size and mediastinal contours are within normal limits. The visualized  skeletal structures are unremarkable. IMPRESSION: 1. Moderate severity interstitial edema with mild to moderate severity left basilar atelectasis and/or infiltrate. 2. Small bilateral pleural effusions. Electronically Signed   By: Aram Candela M.D.   On: 04/08/2020 19:52     Echo: Pending   TELEMETRY: Sinus tachycardia, rate in the low 100s   ASSESSMENT AND PLAN:  Active Problems:   Acute CHF (congestive heart failure) (HCC)   1.  Acute CHF              -New onset, echocardiogram completed, results pending   -Continue Lasix 20mg  BID and HCTZ 25mg  daily               -Daily weights, I's and O's recommended; has diuresed 1.7 Liters since admission              -Discussed the importance of a low sodium diet, daily weights upon discharge   -Clinically has significantly improved; pending echocardiogram results, will consider discharge today with close outpatient follow up with Dr. or PA-C within 7-10 days of discharge                2.  Elevated troponin              -Borderline elevated but flat, 126, 134, and 130 respectively; in the absence of chest pain, likely demand ischemia from new onset CHF              -Echocardiogram pending   3.  Hypertension              -Continue HCTZ 25mg  daily and lisinopril 20mg  daily   -Lasix 20mg  BID initiated this admission   4.  Hypokalemia              -Continue to monitor and replete as warranted   5.  Tobacco abuse   -Stressed the importance of smoking cessation    The history, physical exam findings, and plan of care were all discussed with Dr. Marga Hoots, and all decision making was made in collaboration.   9-10  PA-C 04/10/2020 7:28 AM

## 2020-04-10 NOTE — Progress Notes (Signed)
   Heart Failure Nurse Navigator Note  HFrEF 30 to 35% right ventricular systolic function is normal.  Mild by atrial enlargement. Mild aortic insufficiency.  He presented to the emergency room with complaints of pain, dyspnea on exertion, lower extremity edema, orthopnea and PND.  Comorbidities  Type 2 diabetes Hypertension Continued tobacco abuse   Medications:  Lasix 20 mg IV twice daily Hydrochlorothiazide 25 mg daily Lisinopril 20 mg daily  Labs:  Sodium 137, potassium 3.5, chloride 99, CO2 25, BUN 13, creatinine 0.96.  Intake 240 mL Output 4 1825 mL Reds clip reading was 46 Blood pressure 129/86 BMI 8.52    Assessment:  General he is awake and alert sitting up in the chair at bedside in no acute distress.  HEENT-pupils are equal and react to light.  Cardiac-heart tones of regular rate and rhythm.  No murmurs or gallops appreciated.  Chest-breath sounds are clear to posterior auscultation   Abdomen rounded soft nontender.   Musculoskeletal-trace lower extremity edema.  Psych-he is pleasant, very talkative and appropriate makes good eye contact.  Neurologic-speech is clear, pupils are equal.    By  teach back method discussed with patient how he is going to take care of himself once he has at home.  Discussed daily weights, sodium and fluid restriction.  Discussed following up in heart failure clinic, given appointment, he voices understanding.    Tresa Endo RN [facility]

## 2020-04-10 NOTE — Progress Notes (Signed)
PROGRESS NOTE    Cole Collins  SWF:[id number] DOB: 13-Apr-1951 DOA: 04/08/2020 PCP: Gracelyn Nurse, MD   Brief Narrative: Taken from H&P. Cole Collins  is a 69 y.o. Caucasian male with a known history of type 2 diabetes mellitus and hypertension as well as tobacco abuse, presented to the emergency room with acute onset of dyspnea with associated orthopnea and paroxysmal nocturnal dyspnea, worsening lower extremity edema as well as dyspnea on exertion which have been worsening over the last 10 days. Elevated BNP at 1322 and mildly elevated troponin most likely secondary to demand ischemia.  Patient was started on IV diuresis.  Cardiology was consulted.  Initially received heparin infusion which was discontinued later as there was no chest pain and increasing troponin was most likely secondary to demand ischemia. Echocardiogram pending  Subjective: Patient continued to feel some shortness of breath, stating that it is much better than before.  Assessment & Plan:   Active Problems:   Acute CHF (congestive heart failure) (HCC)  New onset acute HFrEF.  Troponin mildly elevated most likely secondary to demand.  No ACS per cardiology, off heparin.  Echo with ef 30-35%. Symptomatically improved w/ diuresis, out neg 1200 yesterday - cardiology consulted, appreciate recs - continue lasix 20 IV bid - defer evidence-based med initiation and ischemic w/u to cardiology, will start sglt2i as belo2 - strict I/o, daily weights - f/u tsh  Hypokalemia.  Resolved but borderline low at 3.5 on hctz and lasix -start kcl 20 qd  Type 2 diabetes mellitus.  Patient was on glipizide and Metformin at home. a1c 8.3 -Continue with SSI - start invokana given new hfref - holding home metformin and glipizide, likely hold glip @ d/c given start of invokana  Essential hypertension.  BP wnl this morning -Continue with home dose of Zestril and HCTZ. -Patient is on Lasix.  Constipation Occasional at home -  start daily colace  Objective: Vitals:   04/10/20 0309 04/10/20 0442 04/10/20 0819 04/10/20 1239  BP:  (!) 131/93 (!) 134/93 129/86  Pulse:  100 (!) 108 (!) 106  Resp:   16 16  Temp:  97.6 F (36.4 C) 98.4 F (36.9 C) 99 F (37.2 C)  TempSrc:  Oral  Oral  SpO2:  95% 98% 96%  Weight: 90.2 kg     Height:        Intake/Output Summary (Last 24 hours) at 04/10/2020 1257 Last data filed at 04/10/2020 0537 Gross per 24 hour  Intake 240 ml  Output 200 ml  Net 40 ml   Filed Weights   04/08/20 1920 04/09/20 0322 04/10/20 0309  Weight: 94.8 kg 91.9 kg 90.2 kg    Examination:  General exam: Appears calm and comfortable  Respiratory system: Clear to auscultation. Respiratory effort normal. Cardiovascular system: S1 & S2 heard, RRR. Gastrointestinal system: Soft, nontender, nondistended, bowel sounds positive. Central nervous system: Alert and oriented. No focal neurological deficits. Extremities: 2+ LE edema, no cyanosis, pulses intact and symmetrical. Psychiatry: Judgement and insight appear normal. Mood & affect appropriate.    DVT prophylaxis: Lovenox Code Status:  Family Communication: Discussed with patient Disposition Plan:  Status is: Inpatient  Remains inpatient appropriate because:Inpatient level of care appropriate due to severity of illness   Dispo: The patient is from: Home              Anticipated d/c is to: Home              Anticipated d/c date is: 1 day  Patient currently is not medically stable to d/c.   Consultants:   Cardiology  Procedures:  Antimicrobials:   Data Reviewed: I have personally reviewed following labs and imaging studies  CBC: Recent Labs  Lab 04/08/20 1926 04/09/20 0431  WBC 11.3* 10.1  HGB 13.9 13.2  HCT 41.4 40.2  MCV 91.8 93.9  PLT 386 345   Basic Metabolic Panel: Recent Labs  Lab 04/08/20 1926 04/09/20 0845 04/10/20 0548  NA 138 138 137  K 3.4* 3.7 3.5  CL 103 100 99  CO2 22 24 25   GLUCOSE 211*  204* 191*  BUN 10 11 13   CREATININE 0.89 0.86 0.96  CALCIUM 8.6* 8.8* 9.1   GFR: Estimated Creatinine Clearance: 79.8 mL/min (by C-G formula based on SCr of 0.96 mg/dL). Liver Function Tests: Recent Labs  Lab 04/08/20 1926  AST 21  ALT 23  ALKPHOS 61  BILITOT 0.8  PROT 7.4  ALBUMIN 3.3*   No results for input(s): LIPASE, AMYLASE in the last 168 hours. No results for input(s): AMMONIA in the last 168 hours. Coagulation Profile: Recent Labs  Lab 04/08/20 2137  INR 1.1   Cardiac Enzymes: No results for input(s): CKTOTAL, CKMB, CKMBINDEX, TROPONINI in the last 168 hours. BNP (last 3 results) No results for input(s): PROBNP in the last 8760 hours. HbA1C: Recent Labs    04/08/20 1926  HGBA1C 8.3*   CBG: Recent Labs  Lab 04/09/20 1648 04/09/20 2017 04/10/20 0820 04/10/20 1238  GLUCAP 159* 191* 187* 146*   Lipid Profile: No results for input(s): CHOL, HDL, LDLCALC, TRIG, CHOLHDL, LDLDIRECT in the last 72 hours. Thyroid Function Tests: No results for input(s): TSH, T4TOTAL, FREET4, T3FREE, THYROIDAB in the last 72 hours. Anemia Panel: No results for input(s): VITAMINB12, FOLATE, FERRITIN, TIBC, IRON, RETICCTPCT in the last 72 hours. Sepsis Labs: No results for input(s): PROCALCITON, LATICACIDVEN in the last 168 hours.  Recent Results (from the past 240 hour(s))  Resp Panel by RT-PCR (Flu A&B, Covid) Nasopharyngeal Swab     Status: None   Collection Time: 04/08/20  7:26 PM   Specimen: Nasopharyngeal Swab; Nasopharyngeal(NP) swabs in vial transport medium  Result Value Ref Range Status   SARS Coronavirus 2 by RT PCR NEGATIVE NEGATIVE Final    Comment: (NOTE) SARS-CoV-2 target nucleic acids are NOT DETECTED.  The SARS-CoV-2 RNA is generally detectable in upper respiratory specimens during the acute phase of infection. The lowest concentration of SARS-CoV-2 viral copies this assay can detect is 138 copies/mL. A negative result does not preclude  SARS-Cov-2 infection and should not be used as the sole basis for treatment or other patient management decisions. A negative result may occur with  improper specimen collection/handling, submission of specimen other than nasopharyngeal swab, presence of viral mutation(s) within the areas targeted by this assay, and inadequate number of viral copies(<138 copies/mL). A negative result must be combined with clinical observations, patient history, and epidemiological information. The expected result is Negative.  Fact Sheet for Patients:  14/08/21  Fact Sheet for Healthcare Providers:  14/06/21  This test is no t yet approved or cleared by the [employer] FDA and  has been authorized for detection and/or diagnosis of SARS-CoV-2 by FDA under an Emergency Use Authorization (EUA). This EUA will remain  in effect (meaning this test can be used) for the duration of the COVID-19 declaration under Section 564(b)(1) of the Act, 21 U.S.C.section 360bbb-3(b)(1), unless the authorization is terminated  or revoked sooner.       Influenza  A by PCR NEGATIVE NEGATIVE Final   Influenza B by PCR NEGATIVE NEGATIVE Final    Comment: (NOTE) The Xpert Xpress SARS-CoV-2/FLU/RSV plus assay is intended as an aid in the diagnosis of influenza from Nasopharyngeal swab specimens and should not be used as a sole basis for treatment. Nasal washings and aspirates are unacceptable for Xpert Xpress SARS-CoV-2/FLU/RSV testing.  Fact Sheet for Patients: [employer]  Fact Sheet for Healthcare Providers: [employer]it  This test is not yet approved or cleared by the [place] FDA and has been authorized for detection and/or diagnosis of SARS-CoV-2 by FDA under an Emergency Use Authorization (EUA). This EUA will remain in effect (meaning this test can be used) for the duration of  the COVID-19 declaration under Section 564(b)(1) of the Act, 21 U.S.C. section 360bbb-3(b)(1), unless the authorization is terminated or revoked.  Performed at [facility], [address] [place], [place] [address]      Radiology Studies: DG Chest 2 View  Result Date: 04/08/2020 CLINICAL DATA:  Shortness of breath. EXAM: CHEST - 2 VIEW COMPARISON:  None. FINDINGS: Moderate severity diffusely increased interstitial lung markings are seen with mild prominence of the perihilar pulmonary vasculature. Mild to moderate severity atelectasis and/or infiltrate is seen within the left lung base. Small bilateral pleural effusions are seen. No pneumothorax is identified. The heart size and mediastinal contours are within normal limits. The visualized skeletal structures are unremarkable. IMPRESSION: 1. Moderate severity interstitial edema with mild to moderate severity left basilar atelectasis and/or infiltrate. 2. Small bilateral pleural effusions. Electronically Signed   By: Aram Candela M.D.   On: 04/08/2020 19:52   ECHOCARDIOGRAM COMPLETE  Result Date: 04/10/2020    ECHOCARDIOGRAM REPORT   Patient Name:   Cole Collins Date of Exam: 04/09/2020 Medical Rec #:  [id number]        Height:       70.0 in Accession #:    [id number]       Weight:       202.7 lb Date of Birth:  [birth year]         BSA:          2.099 m Patient Age:    69 years         BP:           139/94 mmHg Patient Gender: M                HR:           106 bpm. Exam Location:  [facility] Procedure: 2D Echo, Color Doppler, Cardiac Doppler and Intracardiac            Opacification Agent Indications:     I50.9 Congestive Heart Failure  History:         Patient has no prior history of Echocardiogram examinations.                  Risk Factors:Hypertension and Diabetes.  Sonographer:     Humphrey Rolls RDCS (AE) Referring Phys:  [id number] Melanee Spry STEPHENS Diagnosing Phys: Harold Hedge MD  Sonographer Comments: Suboptimal parasternal window and  suboptimal subcostal window. IMPRESSIONS  1. Left ventricular ejection fraction, by estimation, is 30 to 35%. The left ventricle has moderately decreased function. The left ventricle demonstrates global hypokinesis. The left ventricular internal cavity size was mildly dilated. Left ventricular diastolic parameters were normal.  2. Right ventricular systolic function is normal. The right ventricular size is normal.  3. Left atrial size was mildly  dilated.  4. Right atrial size was mildly dilated.  5. Moderate pleural effusion in the left lateral region.  6. The mitral valve was not well visualized. Trivial mitral valve regurgitation.  7. The aortic valve is grossly normal. Aortic valve regurgitation is mild. FINDINGS  Left Ventricle: Left ventricular ejection fraction, by estimation, is 30 to 35%. The left ventricle has moderately decreased function. The left ventricle demonstrates global hypokinesis. Definity contrast agent was given IV to delineate the left ventricular endocardial borders. The left ventricular internal cavity size was mildly dilated. There is no left ventricular hypertrophy. Left ventricular diastolic parameters were normal. Right Ventricle: The right ventricular size is normal. No increase in right ventricular wall thickness. Right ventricular systolic function is normal. Left Atrium: Left atrial size was mildly dilated. Right Atrium: Right atrial size was mildly dilated. Pericardium: There is no evidence of pericardial effusion. Mitral Valve: The mitral valve was not well visualized. Trivial mitral valve regurgitation. MV peak gradient, 7.6 mmHg. The mean mitral valve gradient is 4.0 mmHg. Tricuspid Valve: The tricuspid valve is not well visualized. Tricuspid valve regurgitation is trivial. Aortic Valve: The aortic valve is grossly normal. Aortic valve regurgitation is mild. Aortic regurgitation PHT measures 486 msec. Aortic valve mean gradient measures 4.0 mmHg. Aortic valve peak gradient  measures 7.7 mmHg. Aortic valve area, by VTI measures 1.92 cm. Pulmonic Valve: The pulmonic valve was not well visualized. Pulmonic valve regurgitation is not visualized. Aorta: The aortic root is normal in size and structure. IAS/Shunts: The interatrial septum was not assessed. Additional Comments: There is a moderate pleural effusion in the left lateral region.  LEFT VENTRICLE PLAX 2D LVIDd:         5.48 cm      Diastology LVIDs:         3.92 cm      LV e' medial:    8.92 cm/s LV PW:         1.04 cm      LV E/e' medial:  13.3 LV IVS:        0.70 cm      LV e' lateral:   11.40 cm/s LVOT diam:     2.20 cm      LV E/e' lateral: 10.4 LV SV:         39 LV SV Index:   19 LVOT Area:     3.80 cm  LV Volumes (MOD) LV vol d, MOD A2C: 161.0 ml LV vol d, MOD A4C: 165.0 ml LV vol s, MOD A2C: 142.0 ml LV vol s, MOD A4C: 113.0 ml LV SV MOD A2C:     19.0 ml LV SV MOD A4C:     165.0 ml LV SV MOD BP:      38.1 ml RIGHT VENTRICLE RV Basal diam:  3.20 cm LEFT ATRIUM             Index       RIGHT ATRIUM           Index LA diam:        4.50 cm 2.14 cm/m  RA Area:     12.00 cm LA Vol (A2C):   52.0 ml 24.77 ml/m RA Volume:   26.80 ml  12.77 ml/m LA Vol (A4C):   37.2 ml 17.72 ml/m LA Biplane Vol: 45.6 ml 21.72 ml/m  AORTIC VALVE                    PULMONIC VALVE AV Area (Vmax):  1.96 cm     PV Vmax:       0.72 m/s AV Area (Vmean):   1.87 cm     PV Vmean:      46.600 cm/s AV Area (VTI):     1.92 cm     PV VTI:        0.086 m AV Vmax:           139.00 cm/s  PV Peak grad:  2.1 mmHg AV Vmean:          100.000 cm/s PV Mean grad:  1.0 mmHg AV VTI:            0.204 m AV Peak Grad:      7.7 mmHg AV Mean Grad:      4.0 mmHg LVOT Vmax:         71.80 cm/s LVOT Vmean:        49.200 cm/s LVOT VTI:          0.103 m LVOT/AV VTI ratio: 0.50 AI PHT:            486 msec  AORTA Ao Root diam: 3.40 cm MITRAL VALVE MV Area (PHT): 10.39 cm    SHUNTS MV Peak grad:  7.6 mmHg     Systemic VTI:  0.10 m MV Mean grad:  4.0 mmHg     Systemic Diam: 2.20  cm MV Vmax:       1.38 m/s MV Vmean:      89.5 cm/s MV Decel Time: 73 msec MV E velocity: 118.67 cm/s Harold Hedge MD Electronically signed by Harold Hedge MD Signature Date/Time: 04/10/2020/12:04:28 PM    Final     Scheduled Meds: . docusate sodium  100 mg Oral Daily  . enoxaparin (LOVENOX) injection  40 mg Subcutaneous Q24H  . furosemide  20 mg Intravenous BID  . hydrochlorothiazide  25 mg Oral Daily  . insulin aspart  0-15 Units Subcutaneous TID WC  . insulin aspart  0-5 Units Subcutaneous QHS  . lisinopril  20 mg Oral Daily  . potassium chloride  20 mEq Oral Daily   Continuous Infusions:   LOS: 2 days   Time spent: 35 minutes.  Silvano Bilis, MD [facility]  If 7PM-7AM, please contact night-coverage [email or web address]  04/10/2020, 12:57 PM

## 2020-04-10 NOTE — TOC Progression Note (Signed)
Transition of Care [facility]) - Progression Note    Patient Details  Name: EHAN FREAS MRN: [id number] Date of Birth: [birth year]  Transition of Care [facility]) CM/SW Contact  Hetty Ely, RN Phone Number: 04/10/2020, 2:05 PM  Clinical Narrative:   Patient given scale to take home to do daily weights. Advised patient to keep log of weights and take to doctor's appointments. Patient voices understanding.         Expected Discharge Plan and Services                                                 Social Determinants of Health (SDOH) Interventions    Readmission Risk Interventions No flowsheet data found.

## 2020-04-10 NOTE — Progress Notes (Signed)
Echocardiogram revealed moderately reduced LV systolic function with an EF estimated between 30-35% with global hypokinesis; moderate left pleural effusion; left and right atria were mildly dilated. Mild AI noted - results discussed with patient.   Will continue lisinopril 20mg  and consider transitioning to [facility] in an outpatient setting following wash out period.  Will add carvedilol 6.25mg  BID and titrate dose as tolerated.  Will transition to oral Lasix 20mg  once daily and continue HCTZ 25mg  once daily with potassium supplementation.   Will reassess clinical status tomorrow and consider discharge with an ischemic workup in an outpatient setting.

## 2020-04-10 NOTE — Progress Notes (Signed)
Mobility Specialist - Progress Note   04/10/20 1500  Mobility  Activity Ambulated in hall;Ambulated in room;Sat and stood x 3  Level of Assistance Independent  Assistive Device None  Distance Ambulated (ft) 200 ft  Mobility Response Tolerated well  Mobility performed by Mobility specialist  $Mobility charge 1 Mobility    Pre-mobility: 107 HR, 93% SpO2 During mobility: 114 HR, 96% SpO2 Post-mobility: 109 HR, 96% SpO2   Pt was sitting in recliner upon arrival utilizing room air. Pt agreed to session. Pt denied any pain, nausea, or fatigue. Pt stated he did not use AD PTA. Pt independent in all OOB activity this date, including ambulation. Pt performed STSx3 from recliner and progressed to ambulation in room x2. O2 sats up with activity. Pt sat back EOB and was motivated to continue ambulation into hallway. Pt was instructed to use rest break for energy conservation prior to further mobility. Pt ambulated 180' into hallway without AD. No LOB noted. Pt denied weakness or dizziness. Noted heavy breathing, O2 > 92% throughout session and again, sat up to high 90s with activity. Max HR 114 bpm with activity. Upon return to room, pt voiced feeling slightly winded & voiced limitations d/t activity in the room prior to ambu hallway. Overall, pt tolerated session well. Pt was left in recliner with all needs in reach. Nurse notified of performance.    Filiberto Pinks Mobility Specialist 04/10/20, 3:20 PM

## 2020-04-11 ENCOUNTER — Other Ambulatory Visit: Payer: Self-pay | Admitting: Obstetrics and Gynecology

## 2020-04-11 LAB — GLUCOSE, CAPILLARY
Glucose-Capillary: 227 mg/dL — ABNORMAL HIGH (ref 70–99)
Glucose-Capillary: 186 mg/dL — ABNORMAL HIGH (ref 70–99)

## 2020-04-11 LAB — BASIC METABOLIC PANEL
Anion gap: 11 (ref 5–15)
BUN: 15 mg/dL (ref 8–23)
CO2: 27 mmol/L (ref 22–32)
Calcium: 8.6 mg/dL — ABNORMAL LOW (ref 8.9–10.3)
Chloride: 98 mmol/L (ref 98–111)
Creatinine, Ser: 0.89 mg/dL (ref 0.61–1.24)
GFR, Estimated: >60 mL/min (ref >60)
Glucose, Bld: 161 mg/dL — ABNORMAL HIGH (ref 70–99)
Potassium: 3.6 mmol/L (ref 3.5–5.1)
Sodium: 136 mmol/L (ref 135–145)

## 2020-04-11 MED ORDER — FUROSEMIDE 20 MG PO TABS: 20.0000 mg | ORAL_TABLET | Freq: Every day | ORAL | 1 refills | Status: AC

## 2020-04-11 MED ORDER — POTASSIUM CHLORIDE CRYS ER 20 MEQ PO TBCR
20.0000 meq | EXTENDED_RELEASE_TABLET | Freq: Every day | ORAL | 1 refills | Status: DC
Start: 2020-04-11 — End: 2022-02-16

## 2020-04-11 MED ORDER — CANAGLIFLOZIN 100 MG PO TABS
100.0000 mg | ORAL_TABLET | Freq: Every day | ORAL | 1 refills | Status: DC
Start: 2020-04-11 — End: 2020-07-10

## 2020-04-11 MED ORDER — CARVEDILOL 6.25 MG PO TABS
6.2500 mg | ORAL_TABLET | Freq: Two times a day (BID) | ORAL | 1 refills | Status: DC
Start: 2020-04-11 — End: 2020-07-10

## 2020-04-11 MED ORDER — DOCUSATE SODIUM 100 MG PO CAPS
100.0000 mg | ORAL_CAPSULE | Freq: Every day | ORAL | 0 refills | Status: DC
Start: 2020-04-11 — End: 2020-07-10

## 2020-04-11 NOTE — Plan of Care (Signed)
  Problem: Education: Goal: Knowledge of General Education information will improve Description: Including pain rating scale, medication(s)/side effects and non-pharmacologic comfort measures Outcome: Adequate for Discharge   

## 2020-04-11 NOTE — Discharge Summary (Signed)
DERMOT Collins YSA:[id number] DOB: 10-14-1950 DOA: 04/08/2020  PCP: Gracelyn Nurse, MD  Admit date: 04/08/2020 Discharge date: 04/11/2020  Time spent: 25 minutes  Recommendations for Outpatient Follow-up:  1. F/u [facility] cardiology 1 week, f/u [facility] heart failure clinic scheduled    Discharge Diagnoses:  Active Problems:   Acute CHF (congestive heart failure) (HCC)   Discharge Condition: good  Diet recommendation: heart healthy  Filed Weights   04/09/20 0322 04/10/20 0309 04/11/20 0642  Weight: 91.9 kg 90.2 kg 89.1 kg    History of present illness:  Cole Collins  is a 69 y.o. Caucasian male with a known history of type 2 diabetes mellitus and hypertension as well as tobacco abuse, presented to the emergency room with acute onset of dyspnea with associated orthopnea and paroxysmal nocturnal dyspnea, worsening lower extremity edema as well as dyspnea on exertion which have been worsening over the last 10 days.  He denies any cough or wheezing.  No chest pain or palpitations.  No headache or dizziness or blurred vision.  Upon presentation to the emergency room, blood pressure was 155/95 with a heart rate of 116 with otherwise normal vital signs.  Labs revealed mild hypokalemia of 3.4 and hyperglycemia of 211 and albumin 3.3.  High-sensitivity troponin I was 126 and later 134.  CBC showed no leukocytosis 11.3.  Influenza antigens and COVID-19 PCR came back negative.EKG showed accelerated junctional rhythm with occasional PVCs, left axis deviation and T wave inversion laterally.  Chest x-ray showed moderate severity interstitial edema with mild to moderate severity left basal atelectasis and/or infiltrate with small bilateral pleural effusions.  The patient was given 1 mL of IV Lasix and IV heparin bolus and drip.  He will be admitted to a telemetry bed for further evaluation and management.  Hospital Course:  New onset acute HFrEF.  Troponin mildly elevated most likely secondary to  demand.  No ACS per cardiology, off heparin.  Echo with ef 30-35%. Symptomatically improved w/ diuresis,. - cardiology consulted, appreciate recs - continue lasix 20 IV bid - start coreg and sglt2i  Hypokalemia.  resolved -start kcl 20 qd  Type 2 diabetes mellitus.  Patient was on glipizide and Metformin at home. a1c 8.3 -Continue with SSI - start invokana given new hfref - hold metformin at discharge, restart sulfonylurea  Essential hypertension.  BP wnl   Constipation Occasional at home - start daily colace  Procedures:  tte   Consultations:  cardiology  Discharge Exam: Vitals:   04/10/20 2121 04/11/20 0506  BP: 120/89 (!) 108/94  Pulse: (!) 101 90  Resp: 19   Temp: 97.7 F (36.5 C) 97.7 F (36.5 C)  SpO2: 99% 97%    General exam: Appears calm and comfortable  Respiratory system: Clear to auscultation. Respiratory effort normal. Cardiovascular system: S1 & S2 heard, RRR. Gastrointestinal system: Soft, nontender, nondistended, bowel sounds positive. Central nervous system: Alert and oriented. No focal neurological deficits. Extremities: 2+ LE edema, no cyanosis, pulses intact and symmetrical. Psychiatry: Judgement and insight appear normal. Mood & affect appropriate.    Discharge Instructions   Discharge Instructions    Diet - low sodium heart healthy   Complete by: As directed    Increase activity slowly   Complete by: As directed      Allergies as of 04/11/2020   No Known Allergies     Medication List    STOP taking these medications   clindamycin 300 MG capsule Commonly known as: CLEOCIN   metFORMIN 500  MG tablet Commonly known as: GLUCOPHAGE     TAKE these medications   canagliflozin 100 MG Tabs tablet Commonly known as: INVOKANA Take 1 tablet (100 mg total) by mouth daily before breakfast.   carvedilol 6.25 MG tablet Commonly known as: COREG Take 1 tablet (6.25 mg total) by mouth 2 (two) times daily with a meal.   docusate  sodium 100 MG capsule Commonly known as: COLACE Take 1 capsule (100 mg total) by mouth daily.   furosemide 20 MG tablet Commonly known as: LASIX Take 1 tablet (20 mg total) by mouth daily.   glipiZIDE 5 MG tablet Commonly known as: GLUCOTROL Take 5 mg by mouth daily.   hydrochlorothiazide 25 MG tablet Commonly known as: HYDRODIURIL Take 25 mg by mouth daily.   lisinopril 20 MG tablet Commonly known as: ZESTRIL Take 1 tablet (20 mg total) by mouth daily.   potassium chloride SA 20 MEQ tablet Commonly known as: KLOR-CON Take 1 tablet (20 mEq total) by mouth daily.      No Known Allergies  Follow-up Information    [facility] Follow up on 04/18/2020.   Specialty: Cardiology Why: at 11:00am. Enter through the [facility] entrance Contact information: [address] [place] [address] [phone]       Dalia Heading, MD Follow up in 1 week(s).   Specialty: Cardiology Why: Follow up with Dr. Harold Hedge or Marga Hoots PA-C within 1 week of discharge  Contact information: [address] [facility] [address] [place] [address] [phone]                The results of significant diagnostics from this hospitalization (including imaging, microbiology, ancillary and laboratory) are listed below for reference.    Significant Diagnostic Studies: DG Chest 2 View  Result Date: 04/08/2020 CLINICAL DATA:  Shortness of breath. EXAM: CHEST - 2 VIEW COMPARISON:  None. FINDINGS: Moderate severity diffusely increased interstitial lung markings are seen with mild prominence of the perihilar pulmonary vasculature. Mild to moderate severity atelectasis and/or infiltrate is seen within the left lung base. Small bilateral pleural effusions are seen. No pneumothorax is identified. The heart size and mediastinal contours are within normal limits. The visualized skeletal structures are unremarkable. IMPRESSION: 1.  Moderate severity interstitial edema with mild to moderate severity left basilar atelectasis and/or infiltrate. 2. Small bilateral pleural effusions. Electronically Signed   By: Aram Candela M.D.   On: 04/08/2020 19:52   ECHOCARDIOGRAM COMPLETE  Result Date: 04/10/2020    ECHOCARDIOGRAM REPORT   Patient Name:   KURK CORNIEL Date of Exam: 04/09/2020 Medical Rec #:  [id number]        Height:       70.0 in Accession #:    [id number]       Weight:       202.7 lb Date of Birth:  [birth year]         BSA:          2.099 m Patient Age:    69 years         BP:           139/94 mmHg Patient Gender: M                HR:           106 bpm. Exam Location:  [facility] Procedure: 2D Echo, Color Doppler, Cardiac Doppler and Intracardiac  Opacification Agent Indications:     I50.9 Congestive Heart Failure  History:         Patient has no prior history of Echocardiogram examinations.                  Risk Factors:Hypertension and Diabetes.  Sonographer:     Humphrey RollsJoan Heiss RDCS (AE) Referring Phys:  [id number] Melanee SpryICOLE L STEPHENS Diagnosing Phys: Harold HedgeKenneth Fath MD  Sonographer Comments: Suboptimal parasternal window and suboptimal subcostal window. IMPRESSIONS  1. Left ventricular ejection fraction, by estimation, is 30 to 35%. The left ventricle has moderately decreased function. The left ventricle demonstrates global hypokinesis. The left ventricular internal cavity size was mildly dilated. Left ventricular diastolic parameters were normal.  2. Right ventricular systolic function is normal. The right ventricular size is normal.  3. Left atrial size was mildly dilated.  4. Right atrial size was mildly dilated.  5. Moderate pleural effusion in the left lateral region.  6. The mitral valve was not well visualized. Trivial mitral valve regurgitation.  7. The aortic valve is grossly normal. Aortic valve regurgitation is mild. FINDINGS  Left Ventricle: Left ventricular ejection fraction, by estimation, is 30 to 35%. The left ventricle  has moderately decreased function. The left ventricle demonstrates global hypokinesis. Definity contrast agent was given IV to delineate the left ventricular endocardial borders. The left ventricular internal cavity size was mildly dilated. There is no left ventricular hypertrophy. Left ventricular diastolic parameters were normal. Right Ventricle: The right ventricular size is normal. No increase in right ventricular wall thickness. Right ventricular systolic function is normal. Left Atrium: Left atrial size was mildly dilated. Right Atrium: Right atrial size was mildly dilated. Pericardium: There is no evidence of pericardial effusion. Mitral Valve: The mitral valve was not well visualized. Trivial mitral valve regurgitation. MV peak gradient, 7.6 mmHg. The mean mitral valve gradient is 4.0 mmHg. Tricuspid Valve: The tricuspid valve is not well visualized. Tricuspid valve regurgitation is trivial. Aortic Valve: The aortic valve is grossly normal. Aortic valve regurgitation is mild. Aortic regurgitation PHT measures 486 msec. Aortic valve mean gradient measures 4.0 mmHg. Aortic valve peak gradient measures 7.7 mmHg. Aortic valve area, by VTI measures 1.92 cm. Pulmonic Valve: The pulmonic valve was not well visualized. Pulmonic valve regurgitation is not visualized. Aorta: The aortic root is normal in size and structure. IAS/Shunts: The interatrial septum was not assessed. Additional Comments: There is a moderate pleural effusion in the left lateral region.  LEFT VENTRICLE PLAX 2D LVIDd:         5.48 cm      Diastology LVIDs:         3.92 cm      LV e' medial:    8.92 cm/s LV PW:         1.04 cm      LV E/e' medial:  13.3 LV IVS:        0.70 cm      LV e' lateral:   11.40 cm/s LVOT diam:     2.20 cm      LV E/e' lateral: 10.4 LV SV:         39 LV SV Index:   19 LVOT Area:     3.80 cm  LV Volumes (MOD) LV vol d, MOD A2C: 161.0 ml LV vol d, MOD A4C: 165.0 ml LV vol s, MOD A2C: 142.0 ml LV vol s, MOD A4C: 113.0 ml LV  SV MOD A2C:     19.0 ml LV SV  MOD A4C:     165.0 ml LV SV MOD BP:      38.1 ml RIGHT VENTRICLE RV Basal diam:  3.20 cm LEFT ATRIUM             Index       RIGHT ATRIUM           Index LA diam:        4.50 cm 2.14 cm/m  RA Area:     12.00 cm LA Vol (A2C):   52.0 ml 24.77 ml/m RA Volume:   26.80 ml  12.77 ml/m LA Vol (A4C):   37.2 ml 17.72 ml/m LA Biplane Vol: 45.6 ml 21.72 ml/m  AORTIC VALVE                    PULMONIC VALVE AV Area (Vmax):    1.96 cm     PV Vmax:       0.72 m/s AV Area (Vmean):   1.87 cm     PV Vmean:      46.600 cm/s AV Area (VTI):     1.92 cm     PV VTI:        0.086 m AV Vmax:           139.00 cm/s  PV Peak grad:  2.1 mmHg AV Vmean:          100.000 cm/s PV Mean grad:  1.0 mmHg AV VTI:            0.204 m AV Peak Grad:      7.7 mmHg AV Mean Grad:      4.0 mmHg LVOT Vmax:         71.80 cm/s LVOT Vmean:        49.200 cm/s LVOT VTI:          0.103 m LVOT/AV VTI ratio: 0.50 AI PHT:            486 msec  AORTA Ao Root diam: 3.40 cm MITRAL VALVE MV Area (PHT): 10.39 cm    SHUNTS MV Peak grad:  7.6 mmHg     Systemic VTI:  0.10 m MV Mean grad:  4.0 mmHg     Systemic Diam: 2.20 cm MV Vmax:       1.38 m/s MV Vmean:      89.5 cm/s MV Decel Time: 73 msec MV E velocity: 118.67 cm/s Harold Hedge MD Electronically signed by Harold Hedge MD Signature Date/Time: 04/10/2020/12:04:28 PM    Final     Microbiology: Recent Results (from the past 240 hour(s))  Resp Panel by RT-PCR (Flu A&B, Covid) Nasopharyngeal Swab     Status: None   Collection Time: 04/08/20  7:26 PM   Specimen: Nasopharyngeal Swab; Nasopharyngeal(NP) swabs in vial transport medium  Result Value Ref Range Status   SARS Coronavirus 2 by RT PCR NEGATIVE NEGATIVE Final    Comment: (NOTE) SARS-CoV-2 target nucleic acids are NOT DETECTED.  The SARS-CoV-2 RNA is generally detectable in upper respiratory specimens during the acute phase of infection. The lowest concentration of SARS-CoV-2 viral copies this assay can detect is 138  copies/mL. A negative result does not preclude SARS-Cov-2 infection and should not be used as the sole basis for treatment or other patient management decisions. A negative result may occur with  improper specimen collection/handling, submission of specimen other than nasopharyngeal swab, presence of viral mutation(s) within the areas targeted by this assay, and inadequate number of viral copies(<138 copies/mL). A negative result  must be combined with clinical observations, patient history, and epidemiological information. The expected result is Negative.  Fact Sheet for Patients:  BloggerCourse.com  Fact Sheet for Healthcare Providers:  SeriousBroker.it  This test is no t yet approved or cleared by the [place] FDA and  has been authorized for detection and/or diagnosis of SARS-CoV-2 by FDA under an Emergency Use Authorization (EUA). This EUA will remain  in effect (meaning this test can be used) for the duration of the COVID-19 declaration under Section 564(b)(1) of the Act, 21 U.S.C.section 360bbb-3(b)(1), unless the authorization is terminated  or revoked sooner.       Influenza A by PCR NEGATIVE NEGATIVE Final   Influenza B by PCR NEGATIVE NEGATIVE Final    Comment: (NOTE) The Xpert Xpress SARS-CoV-2/FLU/RSV plus assay is intended as an aid in the diagnosis of influenza from Nasopharyngeal swab specimens and should not be used as a sole basis for treatment. Nasal washings and aspirates are unacceptable for Xpert Xpress SARS-CoV-2/FLU/RSV testing.  Fact Sheet for Patients: BloggerCourse.com  Fact Sheet for Healthcare Providers: SeriousBroker.it  This test is not yet approved or cleared by the [place] FDA and has been authorized for detection and/or diagnosis of SARS-CoV-2 by FDA under an Emergency Use Authorization (EUA). This EUA will remain in effect (meaning  this test can be used) for the duration of the COVID-19 declaration under Section 564(b)(1) of the Act, 21 U.S.C. section 360bbb-3(b)(1), unless the authorization is terminated or revoked.  Performed at [facility], [address] [place], [place] [address]      Labs: Basic Metabolic Panel: Recent Labs  Lab 04/08/20 1926 04/09/20 0845 04/10/20 0548 04/11/20 0539  NA 138 138 137 136  K 3.4* 3.7 3.5 3.6  CL 103 100 99 98  CO2 22 24 25 27   GLUCOSE 211* 204* 191* 161*  BUN 10 11 13 15   CREATININE 0.89 0.86 0.96 0.89  CALCIUM 8.6* 8.8* 9.1 8.6*   Liver Function Tests: Recent Labs  Lab 04/08/20 1926  AST 21  ALT 23  ALKPHOS 61  BILITOT 0.8  PROT 7.4  ALBUMIN 3.3*   No results for input(s): LIPASE, AMYLASE in the last 168 hours. No results for input(s): AMMONIA in the last 168 hours. CBC: Recent Labs  Lab 04/08/20 1926 04/09/20 0431  WBC 11.3* 10.1  HGB 13.9 13.2  HCT 41.4 40.2  MCV 91.8 93.9  PLT 386 345   Cardiac Enzymes: No results for input(s): CKTOTAL, CKMB, CKMBINDEX, TROPONINI in the last 168 hours. BNP: BNP (last 3 results) Recent Labs    04/09/20 0431  BNP 1,322.7*    ProBNP (last 3 results) No results for input(s): PROBNP in the last 8760 hours.  CBG: Recent Labs  Lab 04/09/20 2017 04/10/20 0820 04/10/20 1238 04/10/20 1709 04/10/20 2227  GLUCAP 191* 187* 146* 237* 146*       Signed:  14/08/21 MD.  [facility] 04/11/2020, 8:37 AM

## 2020-04-11 NOTE — Progress Notes (Signed)
[facility]    SUBJECTIVE: Mr. Bethel is a 69 year male with a past medical history significant for type 2 diabetes, hypertension, and tobacco abuse who presented to the ED on 04/08/20 for a 1-2 week history of worsening exertional dyspnea, lower extremity swelling, orthopnea, and PND. Workup in the ED was significant for WBC of 11.3, potassium of 3.4, high sensitivity troponin elevated x 3, 126, 134, and 130 respectively, BNP of 1322, COVID-19 negative, chest xray revealing moderate interstitial edema with mild to moderate left basilar atelectasis/infiltrate, and small bilateral pleural effusions, and ECG revealing sinus tachycardia.    04/09/20: Mr. Urbas is sitting up in bed, in no acute distress.  He reports shortness of breath and lower extremity swelling have mildly improved since admission. He continues to deny chest pain or chest pressure.  He denies dizziness, lightheadedness, or syncopal/presyncopal episodes.  He lives alone and reports eating 2 meals out a day.   04/10/20: Reports significant improvement in shortness of breath, orthopnea, PND, and lower extremity swelling.  Ambulated around the floor this morning and admitted to only mild dyspnea.  Continues to deny chest pain or chest pressure.    04/11/20: Besides mild fatigue, Mr. Costales reports feeling back at his baseline and denies shortness of breath, orthopnea, PND, or lower extremity swelling.  Also denies chest pain, chest pressure, heart racing, or palpitations.  Despite relative hypotension, is asymptomatic and denies dizziness, lightheadedness, or syncopal/presyncopal episodes.     Vitals:   04/10/20 1709 04/10/20 2121 04/11/20 0506 04/11/20 0642  BP: 115/85 120/89 (!) 108/94   Pulse: (!) 105 (!) 101 90   Resp: 16 19    Temp: 99.3 F (37.4 C) 97.7 F (36.5 C) 97.7 F (36.5 C)   TempSrc: Oral Oral Oral   SpO2: 94% 99% 97%   Weight:    89.1 kg  Height:         Intake/Output Summary (Last 24 hours) at 04/11/2020  6010 Last data filed at 04/11/2020 9323 Gross per 24 hour  Intake --  Output 950 ml  Net -950 ml      PHYSICAL EXAM  General: Well developed, well nourished, in no acute distress HEENT:  Normocephalic and atramatic Neck:  No JVD.  Lungs: Clear bilaterally to auscultation and percussion. Heart: HRRR . Normal S1 and S2 without gallops or murmurs.  Abdomen: Bowel sounds are positive, abdomen soft and non-tender  Msk:  Back normal.  Normal strength and tone for age. Extremities: No clubbing, cyanosis or edema.   Neuro: Alert and oriented X 3. Psych:  Good affect, responds appropriately   LABS: Basic Metabolic Panel: Recent Labs    04/10/20 0548 04/11/20 0539  NA 137 136  K 3.5 3.6  CL 99 98  CO2 25 27  GLUCOSE 191* 161*  BUN 13 15  CREATININE 0.96 0.89  CALCIUM 9.1 8.6*   Liver Function Tests: Recent Labs    04/08/20 1926  AST 21  ALT 23  ALKPHOS 61  BILITOT 0.8  PROT 7.4  ALBUMIN 3.3*   No results for input(s): LIPASE, AMYLASE in the last 72 hours. CBC: Recent Labs    04/08/20 1926 04/09/20 0431  WBC 11.3* 10.1  HGB 13.9 13.2  HCT 41.4 40.2  MCV 91.8 93.9  PLT 386 345   Cardiac Enzymes: No results for input(s): CKTOTAL, CKMB, CKMBINDEX, TROPONINI in the last 72 hours. BNP: Invalid input(s): POCBNP D-Dimer: No results for input(s): DDIMER in the last 72 hours. Hemoglobin A1C: Recent  Labs    04/08/20 1926  HGBA1C 8.3*   Fasting Lipid Panel: No results for input(s): CHOL, HDL, LDLCALC, TRIG, CHOLHDL, LDLDIRECT in the last 72 hours. Thyroid Function Tests: Recent Labs    04/10/20 1304  TSH 1.115   Anemia Panel: No results for input(s): VITAMINB12, FOLATE, FERRITIN, TIBC, IRON, RETICCTPCT in the last 72 hours.  ECHOCARDIOGRAM COMPLETE  Result Date: 04/10/2020    ECHOCARDIOGRAM REPORT   Patient Name:   SANDEEP RADELL Date of Exam: 04/09/2020 Medical Rec #:  [id number]        Height:       70.0 in Accession #:    [id number]       Weight:        202.7 lb Date of Birth:  06-10-1950         BSA:          2.099 m Patient Age:    69 years         BP:           139/94 mmHg Patient Gender: M                HR:           106 bpm. Exam Location:  [facility] Procedure: 2D Echo, Color Doppler, Cardiac Doppler and Intracardiac            Opacification Agent Indications:     I50.9 Congestive Heart Failure  History:         Patient has no prior history of Echocardiogram examinations.                  Risk Factors:Hypertension and Diabetes.  Sonographer:     Humphrey Rolls RDCS (AE) Referring Phys:  [id number] Melanee Spry Kenton Fortin Diagnosing Phys: Harold Hedge MD  Sonographer Comments: Suboptimal parasternal window and suboptimal subcostal window. IMPRESSIONS  1. Left ventricular ejection fraction, by estimation, is 30 to 35%. The left ventricle has moderately decreased function. The left ventricle demonstrates global hypokinesis. The left ventricular internal cavity size was mildly dilated. Left ventricular diastolic parameters were normal.  2. Right ventricular systolic function is normal. The right ventricular size is normal.  3. Left atrial size was mildly dilated.  4. Right atrial size was mildly dilated.  5. Moderate pleural effusion in the left lateral region.  6. The mitral valve was not well visualized. Trivial mitral valve regurgitation.  7. The aortic valve is grossly normal. Aortic valve regurgitation is mild. FINDINGS  Left Ventricle: Left ventricular ejection fraction, by estimation, is 30 to 35%. The left ventricle has moderately decreased function. The left ventricle demonstrates global hypokinesis. Definity contrast agent was given IV to delineate the left ventricular endocardial borders. The left ventricular internal cavity size was mildly dilated. There is no left ventricular hypertrophy. Left ventricular diastolic parameters were normal. Right Ventricle: The right ventricular size is normal. No increase in right ventricular wall thickness. Right ventricular systolic  function is normal. Left Atrium: Left atrial size was mildly dilated. Right Atrium: Right atrial size was mildly dilated. Pericardium: There is no evidence of pericardial effusion. Mitral Valve: The mitral valve was not well visualized. Trivial mitral valve regurgitation. MV peak gradient, 7.6 mmHg. The mean mitral valve gradient is 4.0 mmHg. Tricuspid Valve: The tricuspid valve is not well visualized. Tricuspid valve regurgitation is trivial. Aortic Valve: The aortic valve is grossly normal. Aortic valve regurgitation is mild. Aortic regurgitation PHT measures 486 msec. Aortic valve mean gradient measures 4.0 mmHg. Aortic  valve peak gradient measures 7.7 mmHg. Aortic valve area, by VTI measures 1.92 cm. Pulmonic Valve: The pulmonic valve was not well visualized. Pulmonic valve regurgitation is not visualized. Aorta: The aortic root is normal in size and structure. IAS/Shunts: The interatrial septum was not assessed. Additional Comments: There is a moderate pleural effusion in the left lateral region.  LEFT VENTRICLE PLAX 2D LVIDd:         5.48 cm      Diastology LVIDs:         3.92 cm      LV e' medial:    8.92 cm/s LV PW:         1.04 cm      LV E/e' medial:  13.3 LV IVS:        0.70 cm      LV e' lateral:   11.40 cm/s LVOT diam:     2.20 cm      LV E/e' lateral: 10.4 LV SV:         39 LV SV Index:   19 LVOT Area:     3.80 cm  LV Volumes (MOD) LV vol d, MOD A2C: 161.0 ml LV vol d, MOD A4C: 165.0 ml LV vol s, MOD A2C: 142.0 ml LV vol s, MOD A4C: 113.0 ml LV SV MOD A2C:     19.0 ml LV SV MOD A4C:     165.0 ml LV SV MOD BP:      38.1 ml RIGHT VENTRICLE RV Basal diam:  3.20 cm LEFT ATRIUM             Index       RIGHT ATRIUM           Index LA diam:        4.50 cm 2.14 cm/m  RA Area:     12.00 cm LA Vol (A2C):   52.0 ml 24.77 ml/m RA Volume:   26.80 ml  12.77 ml/m LA Vol (A4C):   37.2 ml 17.72 ml/m LA Biplane Vol: 45.6 ml 21.72 ml/m  AORTIC VALVE                    PULMONIC VALVE AV Area (Vmax):    1.96 cm      PV Vmax:       0.72 m/s AV Area (Vmean):   1.87 cm     PV Vmean:      46.600 cm/s AV Area (VTI):     1.92 cm     PV VTI:        0.086 m AV Vmax:           139.00 cm/s  PV Peak grad:  2.1 mmHg AV Vmean:          100.000 cm/s PV Mean grad:  1.0 mmHg AV VTI:            0.204 m AV Peak Grad:      7.7 mmHg AV Mean Grad:      4.0 mmHg LVOT Vmax:         71.80 cm/s LVOT Vmean:        49.200 cm/s LVOT VTI:          0.103 m LVOT/AV VTI ratio: 0.50 AI PHT:            486 msec  AORTA Ao Root diam: 3.40 cm MITRAL VALVE MV Area (PHT): 10.39 cm    SHUNTS MV Peak grad:  7.6 mmHg  Systemic VTI:  0.10 m MV Mean grad:  4.0 mmHg     Systemic Diam: 2.20 cm MV Vmax:       1.38 m/s MV Vmean:      89.5 cm/s MV Decel Time: 73 msec MV E velocity: 118.67 cm/s Harold Hedge MD Electronically signed by Harold Hedge MD Signature Date/Time: 04/10/2020/12:04:28 PM    Final      Echo: Moderately reduced LV systolic function with an EF estimated between 30-35% with global hypokinesis; moderate left pleural effusion; left and right atria were mildly dilated. Mild AI noted  TELEMETRY: Sinus rhythm to sinus tachycardia; rate in the upper 90s/low 100s  ASSESSMENT AND PLAN:  Active Problems:   Acute CHF (congestive heart failure) (HCC)   1.  New onset HFrEF  -Echocardiogram this admission revealing moderately reduced LV systolic function with an EF estimated between 30-35%   -Carvedilol 6.25mg  BID added this admission; canagliflozin added for underlying diabetes   -Will continue home dose of lisinopril 20mg  daily and consider transitioning to [facility] in an outpatient setting following wash out period   -Recommend discharging on Lasix 20mg  daily and home dose of HCTZ 25mg  daily   -Stressed the importance of limiting salt intake as well as limiting fast food  -Patient tolerated new medications well and is stable to discharge from a cardiac standpoint; will set up outpatient follow up appointment with [facility] or PA-C within 7-10 days of discharge                2.  Elevated troponin              -Borderline elevated but flat, 126, 134, and 130 respectively; in the absence of chest pain, likely demand ischemia from new onset CHF              -Will need an ischemic workup; patient deferred LHC this admission; will obtain a stress test in an outpatient setting and make further recommendations pending results   3.  Hypertension              -Will continue home dose of lisinopril 20mg  daily and HCTZ 25mg  daily   -Carvedilol 6.25mg  BID added; will discharge on Lasix 20mg  daily as well   4.  Hypokalemia              -Recommend discharge on potassium Harold Hedge daily   5.  Tobacco abuse   -Stressed the importance of smoking cessation    The history, physical exam findings, and plan of care were all discussed with Dr. Marga Hoots, and all decision making was made in collaboration.   9-10  PA-C 04/11/2020 7:21 AM

## 2020-04-17 NOTE — Progress Notes (Signed)
Patient ID: CHOSEN GESKE, male    DOB: 07/24/1950, 69 y.o.   MRN: [id number]  HPI  Mr Angerer is a 69 y/o male with a history of DM, HTN, previous tobacco use and chronic heart failure.   Echo report from 04/09/20 reviewed and showed an EF of 30-35% along with mild LAE/RAE.   Admitted 04/08/20 due to shortness of breath and edema. BP initially elevated. Initially given IV lasix and heparin. Cardiology consult obtained. Troponin thought to be due to demand ischemia. Discharged after 3 days.   He presents today for his initial visit with a chief complaint of minimal shortness of breath upon moderate exertion. He describes this as chronic in nature having been present for several years. He has associated fatigue along with this. He denies any difficulty sleeping, abdominal distention, palpitations, pedal edema, chest pain, cough, dizziness or weight gain.  He lives alone and says that he eats out "almost every meal". Has been trying to make better food choices though because he's aware that eating out is giving him more sodium. Currently taking both furosemide and HCTZ.   Past Medical History:  Diagnosis Date  . CHF (congestive heart failure) (HCC)   . Diabetes (HCC) 2019  . Hypertension 2019   No past surgical history on file. Family History  Problem Relation Age of Onset  . Dementia Mother   . Lymphoma Father    Social History   Tobacco Use  . Smoking status: Former Smoker    Packs/day: 0.50    Years: 50.00    Pack years: 25.00    Types: Cigarettes    Quit date: 04/06/2020    Years since quitting: 0.0  . Smokeless tobacco: Never Used  Substance Use Topics  . Alcohol use: Not Currently   No Known Allergies Prior to Admission medications   Medication Sig Start Date End Date Taking? Authorizing Provider  carvedilol (COREG) 6.25 MG tablet Take 1 tablet (6.25 mg total) by mouth 2 (two) times daily with a meal. 04/11/20  Yes Wouk, Wilfred Curtis, MD  docusate sodium (COLACE) 100 MG  capsule Take 1 capsule (100 mg total) by mouth daily. 04/11/20  Yes Wouk, Wilfred Curtis, MD  furosemide (LASIX) 20 MG tablet Take 1 tablet (20 mg total) by mouth daily. 04/11/20  Yes Wouk, Wilfred Curtis, MD  glipiZIDE (GLUCOTROL) 5 MG tablet Take 5 mg by mouth daily. 03/31/20  Yes [provider]  hydrochlorothiazide (HYDRODIURIL) 25 MG tablet Take 25 mg by mouth daily. 11/17/19  Yes [provider]  lisinopril (PRINIVIL,ZESTRIL) 20 MG tablet Take 1 tablet (20 mg total) by mouth daily. Patient taking differently: Take 30 mg by mouth daily. 02/04/18  Yes McManama, Richardson Dopp, FNP  potassium chloride SA (KLOR-CON) 20 MEQ tablet Take 1 tablet (20 mEq total) by mouth daily. 04/11/20  Yes Wouk, Wilfred Curtis, MD  canagliflozin [facility]) 100 MG TABS tablet Take 1 tablet (100 mg total) by mouth daily before breakfast. Patient not taking: Reported on 04/18/2020 04/11/20   Wouk, Wilfred Curtis, MD     Review of Systems  Constitutional: Positive for fatigue. Negative for appetite change.  HENT: Negative for congestion, postnasal drip and sore throat.   Eyes: Negative.   Respiratory: Positive for shortness of breath (minimal). Negative for cough and chest tightness.   Cardiovascular: Negative for chest pain, palpitations and leg swelling.  Gastrointestinal: Negative for abdominal distention and abdominal pain.  Endocrine: Negative.   Genitourinary: Negative.   Musculoskeletal: Negative for back pain  and neck pain.  Skin: Negative.   Allergic/Immunologic: Negative.   Neurological: Negative for dizziness and light-headedness.  Hematological: Negative for adenopathy. Does not bruise/bleed easily.  Psychiatric/Behavioral: Negative for dysphoric mood and sleep disturbance (sleeping on 2 pillows). The patient is not nervous/anxious.     Vitals:   04/18/20 1214  BP: 132/82  Pulse: (!) 101  Resp: 18  SpO2: 98%  Weight: 192 lb 6 oz (87.3 kg)  Height: 5\' 10"  (1.778 m)   Wt Readings from  Last 3 Encounters:  04/18/20 192 lb 6 oz (87.3 kg)  04/11/20 196 lb 8 oz (89.1 kg)  11/26/16 202 lb (91.6 kg)   Lab Results  Component Value Date   CREATININE 0.89 04/11/2020   CREATININE 0.96 04/10/2020   CREATININE 0.86 04/09/2020    Physical Exam Vitals and nursing note reviewed.  Constitutional:      Appearance: He is well-developed.  HENT:     Head: Normocephalic and atraumatic.  Neck:     Vascular: No JVD.  Cardiovascular:     Rate and Rhythm: Regular rhythm. Tachycardia present.  Pulmonary:     Effort: Pulmonary effort is normal. No respiratory distress.     Breath sounds: No wheezing or rales.  Abdominal:     Palpations: Abdomen is soft.     Tenderness: There is no abdominal tenderness.  Musculoskeletal:     Cervical back: Neck supple.     Right lower leg: No tenderness. No edema.     Left lower leg: No tenderness. No edema.  Skin:    General: Skin is warm and dry.  Neurological:     General: No focal deficit present.     Mental Status: He is alert and oriented to person, place, and time.  Psychiatric:        Mood and Affect: Mood normal.        Behavior: Behavior normal.     Assessment & Plan:  1: Chronic heart failure with reduced ejection fraction- - NYHA class II - euvolemic today - weighing daily; reminded to call for an overnight weight gain of >2 pounds or a weekly weight gain of >5 pounds - not adding salt although does eat out almost every meal because he lives alone; encouraged him to make good food choices when he does eat out - saw cardiology 71/11/2019) during recent admission - d/c HCTZ and continue furosemide - consider titrating up carvedilol and/ or changing lisinopril to entresto - patient prefers cardiology adjust medications so as only person is adjusting medications - BNP 04/09/20 was 1322.7  2: HTN- - BP looks good today - saw PCP 14/7/21) 12/19/19 - BMP 04/11/20 reviewed and showed sodium 136, potassium 3.6, creatinine 0.89 and  GFR >60  3: DM- - A1c 04/08/20 was 8.3% - right now is still on glipizide; metformin has not been resumed yet  4: Tobacco use- - quit smoking with recent admission - congratulated on that; encouraged continued cessation  Medication bottles reviewed.   Due to HF stability, will not make a return appointment for patient at this time. Advised that he could call back at any time to schedule another appointment and he was comfortable with this plan.

## 2020-04-18 ENCOUNTER — Ambulatory Visit: Payer: Medicare HMO | Attending: Family | Admitting: Family

## 2020-04-18 ENCOUNTER — Encounter: Payer: Self-pay | Admitting: Family

## 2020-04-18 ENCOUNTER — Other Ambulatory Visit: Payer: Self-pay

## 2020-04-18 VITALS — BP 132/82 | HR 101 | Resp 18 | Ht 70.0 in | Wt 192.4 lb

## 2020-04-18 DIAGNOSIS — Z87891 Personal history of nicotine dependence: Secondary | ICD-10-CM | POA: Insufficient documentation

## 2020-04-18 DIAGNOSIS — I110 Hypertensive heart disease with heart failure: Secondary | ICD-10-CM | POA: Diagnosis not present

## 2020-04-18 DIAGNOSIS — Z79899 Other long term (current) drug therapy: Secondary | ICD-10-CM | POA: Diagnosis not present

## 2020-04-18 DIAGNOSIS — Z720 Tobacco use: Secondary | ICD-10-CM

## 2020-04-18 DIAGNOSIS — I509 Heart failure, unspecified: Secondary | ICD-10-CM | POA: Insufficient documentation

## 2020-04-18 DIAGNOSIS — I10 Essential (primary) hypertension: Secondary | ICD-10-CM

## 2020-04-18 DIAGNOSIS — Z7984 Long term (current) use of oral hypoglycemic drugs: Secondary | ICD-10-CM | POA: Insufficient documentation

## 2020-04-18 DIAGNOSIS — I5022 Chronic systolic (congestive) heart failure: Secondary | ICD-10-CM

## 2020-04-18 DIAGNOSIS — E119 Type 2 diabetes mellitus without complications: Secondary | ICD-10-CM | POA: Diagnosis not present

## 2020-04-18 NOTE — Patient Instructions (Addendum)
Continue weighing daily and call for an overnight weight gain of > 2 pounds or a weekly weight gain of >5 pounds.   Stop taking hydrochlorithiazde    Call us in there future if you need [place].

## 2020-05-03 ENCOUNTER — Other Ambulatory Visit: Payer: Self-pay | Admitting: Obstetrics and Gynecology

## 2020-05-08 ENCOUNTER — Telehealth: Payer: Self-pay | Admitting: Family

## 2020-05-08 NOTE — Telephone Encounter (Signed)
Patient called to voice concerns regarding multiple things. He says that he was having issues with hemorrhoids and rectal pain but when he read the preparation H labels, he was concerned so didn't use it. Says that he was on hold with his PCP office for ~ 30 minutes yesterday trying to get through. He was also asking about who would refill his furosemide since the hospitalist was the one who originally wrote the RX.   When asked if he has mychart so that he can submit his concerns that way instead of trying to reach them over the phone, he says that he's having ipad issues. Says that he does have an appt scheduled with his PCP next week. Encouraged him to f/u with him regarding his GI issues. Also explained that either his PCP or cardiology could write a new RX for his furosemide.   Patient was appreciative of the information.

## 2020-07-03 ENCOUNTER — Other Ambulatory Visit
Admission: RE | Admit: 2020-07-03 | Discharge: 2020-07-03 | Disposition: A | Discharge status: Home / Self Care | Payer: Medicare HMO | Source: Ambulatory Visit | Attending: Cardiology | Admitting: Cardiology

## 2020-07-03 DIAGNOSIS — I5021 Acute systolic (congestive) heart failure: Secondary | ICD-10-CM | POA: Diagnosis present

## 2020-07-03 LAB — BRAIN NATRIURETIC PEPTIDE: B Natriuretic Peptide: 2127.6 pg/mL — ABNORMAL HIGH (ref 0.0–100.0)

## 2020-07-19 ENCOUNTER — Other Ambulatory Visit
Admission: RE | Admit: 2020-07-19 | Discharge: 2020-07-19 | Disposition: A | Discharge status: Home / Self Care | Payer: Medicare HMO | Source: Ambulatory Visit | Attending: Cardiology | Admitting: Cardiology

## 2020-07-19 ENCOUNTER — Other Ambulatory Visit: Payer: Self-pay

## 2020-07-19 DIAGNOSIS — Z20822 Contact with and (suspected) exposure to covid-19: Secondary | ICD-10-CM | POA: Diagnosis not present

## 2020-07-19 DIAGNOSIS — Z01812 Encounter for preprocedural laboratory examination: Secondary | ICD-10-CM | POA: Diagnosis present

## 2020-07-19 LAB — SARS CORONAVIRUS 2 (TAT 6-24 HRS): SARS Coronavirus 2: NEGATIVE

## 2020-07-23 ENCOUNTER — Encounter: Payer: Self-pay | Admitting: Cardiology

## 2020-07-23 ENCOUNTER — Ambulatory Visit
Admission: RE | Admit: 2020-07-23 | Discharge: 2020-07-23 | Disposition: A | Discharge status: Home / Self Care | Payer: Medicare HMO | Attending: Cardiology | Admitting: Cardiology

## 2020-07-23 ENCOUNTER — Encounter
Admission: RE | Disposition: A | Discharge status: Home / Self Care | Payer: Self-pay | Source: Home / Self Care | Attending: Cardiology

## 2020-07-23 ENCOUNTER — Other Ambulatory Visit: Payer: Self-pay

## 2020-07-23 DIAGNOSIS — I351 Nonrheumatic aortic (valve) insufficiency: Secondary | ICD-10-CM | POA: Insufficient documentation

## 2020-07-23 DIAGNOSIS — I2510 Atherosclerotic heart disease of native coronary artery without angina pectoris: Secondary | ICD-10-CM | POA: Diagnosis not present

## 2020-07-23 DIAGNOSIS — I420 Dilated cardiomyopathy: Secondary | ICD-10-CM | POA: Diagnosis not present

## 2020-07-23 HISTORY — PX: LEFT HEART CATH AND CORONARY ANGIOGRAPHY: CATH118249

## 2020-07-23 LAB — GLUCOSE, CAPILLARY: Glucose-Capillary: 135 mg/dL — ABNORMAL HIGH (ref 70–99)

## 2020-07-23 SURGERY — LEFT HEART CATH AND CORONARY ANGIOGRAPHY
Anesthesia: Moderate Sedation | Laterality: Bilateral

## 2020-07-23 MED ORDER — ASPIRIN 81 MG PO CHEW
CHEWABLE_TABLET | ORAL | Status: AC
  Filled 2020-07-23: qty 1

## 2020-07-23 MED ORDER — LABETALOL HCL 5 MG/ML IV SOLN: 10.0000 mg | INTRAVENOUS | Status: DC | PRN

## 2020-07-23 MED ORDER — ACETAMINOPHEN 325 MG PO TABS: 650.0000 mg | ORAL_TABLET | ORAL | Status: DC | PRN

## 2020-07-23 MED ORDER — FENTANYL CITRATE (PF) 100 MCG/2ML IJ SOLN
INTRAMUSCULAR | Status: DC | PRN
  Administered 2020-07-23: 25 ug via INTRAVENOUS

## 2020-07-23 MED ORDER — SODIUM CHLORIDE 0.9 % IV SOLN: 250.0000 mL | INTRAVENOUS | Status: DC | PRN

## 2020-07-23 MED ORDER — FENTANYL CITRATE (PF) 100 MCG/2ML IJ SOLN
INTRAMUSCULAR | Status: AC
  Filled 2020-07-23: qty 2

## 2020-07-23 MED ORDER — SODIUM CHLORIDE 0.9% FLUSH: 3.0000 mL | Freq: Two times a day (BID) | INTRAVENOUS | Status: DC

## 2020-07-23 MED ORDER — IOHEXOL 300 MG/ML  SOLN
INTRAMUSCULAR | Status: DC | PRN
  Administered 2020-07-23: 98 mL

## 2020-07-23 MED ORDER — SODIUM CHLORIDE 0.9% FLUSH: 3.0000 mL | INTRAVENOUS | Status: DC | PRN

## 2020-07-23 MED ORDER — MIDAZOLAM HCL 2 MG/2ML IJ SOLN
INTRAMUSCULAR | Status: DC | PRN
  Administered 2020-07-23: 1 mg via INTRAVENOUS

## 2020-07-23 MED ORDER — ONDANSETRON HCL 4 MG/2ML IJ SOLN: 4.0000 mg | Freq: Four times a day (QID) | INTRAMUSCULAR | Status: DC | PRN

## 2020-07-23 MED ORDER — HEPARIN SODIUM (PORCINE) 1000 UNIT/ML IJ SOLN
INTRAMUSCULAR | Status: DC | PRN
  Administered 2020-07-23: 4000 [IU] via INTRAVENOUS

## 2020-07-23 MED ORDER — MIDAZOLAM HCL 2 MG/2ML IJ SOLN
INTRAMUSCULAR | Status: AC
  Filled 2020-07-23: qty 2

## 2020-07-23 MED ORDER — HEPARIN SODIUM (PORCINE) 1000 UNIT/ML IJ SOLN
INTRAMUSCULAR | Status: AC
  Filled 2020-07-23: qty 1

## 2020-07-23 MED ORDER — HEPARIN (PORCINE) IN NACL 2000-0.9 UNIT/L-% IV SOLN
INTRAVENOUS | Status: DC | PRN
  Administered 2020-07-23: 1000 mL

## 2020-07-23 MED ORDER — VERAPAMIL HCL 2.5 MG/ML IV SOLN
INTRAVENOUS | Status: DC | PRN
  Administered 2020-07-23: 2.5 mg via INTRA_ARTERIAL

## 2020-07-23 MED ORDER — HYDRALAZINE HCL 20 MG/ML IJ SOLN: 10.0000 mg | INTRAMUSCULAR | Status: DC | PRN

## 2020-07-23 MED ORDER — ASPIRIN 81 MG PO CHEW
81.0000 mg | CHEWABLE_TABLET | ORAL | Status: AC
  Administered 2020-07-23: 81 mg via ORAL

## 2020-07-23 MED ORDER — HEPARIN (PORCINE) IN NACL 1000-0.9 UT/500ML-% IV SOLN
INTRAVENOUS | Status: AC
  Filled 2020-07-23: qty 1000

## 2020-07-23 MED ORDER — SODIUM CHLORIDE 0.9 % IV SOLN: INTRAVENOUS | Status: DC

## 2020-07-23 MED ORDER — VERAPAMIL HCL 2.5 MG/ML IV SOLN
INTRAVENOUS | Status: AC
  Filled 2020-07-23: qty 2

## 2020-07-23 MED ORDER — SODIUM CHLORIDE 0.9 % WEIGHT BASED INFUSION: 1.0000 mL/kg/h | INTRAVENOUS | Status: DC

## 2020-07-23 SURGICAL SUPPLY — 12 items
CATH INFINITI 5 FR JL3.5 (CATHETERS) ×2 IMPLANT
CATH INFINITI 5FR ANG PIGTAIL (CATHETERS) ×2 IMPLANT
CATH INFINITI JR4 5F (CATHETERS) ×2 IMPLANT
COVER EZ STRL 42X30 (DRAPES) ×2 IMPLANT
DEVICE RAD TR BAND REGULAR (VASCULAR PRODUCTS) ×2 IMPLANT
DRAPE BRACHIAL (DRAPES) ×2 IMPLANT
GLIDESHEATH SLEND SS 6F .021 (SHEATH) ×2 IMPLANT
GUIDEWIRE INQWIRE 1.5J.035X260 (WIRE) ×1 IMPLANT
INQWIRE 1.5J .035X260CM (WIRE) ×2
KIT SYRINGE INJ CVI SPIKEX1 (MISCELLANEOUS) ×2 IMPLANT
PACK CARDIAC CATH (CUSTOM PROCEDURE TRAY) ×2 IMPLANT
SET ATX SIMPLICITY (MISCELLANEOUS) ×2 IMPLANT

## 2020-07-23 NOTE — Progress Notes (Signed)
Dr. Darrold Junker in at bedside, speaking with pt. Re: cath results and future options. Pt. Verbalizes understanding of conversation.

## 2020-07-24 ENCOUNTER — Encounter: Payer: Self-pay | Admitting: Cardiology

## 2021-02-24 ENCOUNTER — Encounter: Payer: Self-pay | Admitting: Ophthalmology

## 2021-02-24 ENCOUNTER — Encounter: Payer: Self-pay | Admitting: Anesthesiology

## 2021-03-03 NOTE — Discharge Instructions (Signed)

## 2021-03-04 ENCOUNTER — Ambulatory Visit
Admission: RE | Admit: 2021-03-04 | Discharge: 2021-03-04 | Disposition: A | Discharge status: Home / Self Care | Payer: Medicare HMO | Attending: Ophthalmology | Admitting: Ophthalmology

## 2021-03-04 ENCOUNTER — Encounter: Payer: Self-pay | Admitting: Ophthalmology

## 2021-03-04 ENCOUNTER — Ambulatory Visit: Payer: Medicare HMO | Admitting: Anesthesiology

## 2021-03-04 ENCOUNTER — Other Ambulatory Visit: Payer: Self-pay

## 2021-03-04 ENCOUNTER — Encounter
Admission: RE | Disposition: A | Discharge status: Home / Self Care | Payer: Self-pay | Source: Home / Self Care | Attending: Ophthalmology

## 2021-03-04 DIAGNOSIS — Z87891 Personal history of nicotine dependence: Secondary | ICD-10-CM | POA: Insufficient documentation

## 2021-03-04 DIAGNOSIS — H2511 Age-related nuclear cataract, right eye: Secondary | ICD-10-CM | POA: Diagnosis not present

## 2021-03-04 DIAGNOSIS — Z7984 Long term (current) use of oral hypoglycemic drugs: Secondary | ICD-10-CM | POA: Insufficient documentation

## 2021-03-04 DIAGNOSIS — E1136 Type 2 diabetes mellitus with diabetic cataract: Secondary | ICD-10-CM | POA: Diagnosis present

## 2021-03-04 DIAGNOSIS — Z79899 Other long term (current) drug therapy: Secondary | ICD-10-CM | POA: Insufficient documentation

## 2021-03-04 HISTORY — PX: CATARACT EXTRACTION W/PHACO: SHX586

## 2021-03-04 LAB — GLUCOSE, CAPILLARY: Glucose-Capillary: 146 mg/dL — ABNORMAL HIGH (ref 70–99)

## 2021-03-04 SURGERY — PHACOEMULSIFICATION, CATARACT, WITH IOL INSERTION
Anesthesia: Topical | Laterality: Right

## 2021-03-04 SURGERY — PHACOEMULSIFICATION, CATARACT, WITH IOL INSERTION
Anesthesia: Monitor Anesthesia Care | Site: Eye | Laterality: Right

## 2021-03-04 MED ORDER — LACTATED RINGERS IV SOLN: INTRAVENOUS | Status: DC

## 2021-03-04 MED ORDER — BRIMONIDINE TARTRATE-TIMOLOL 0.2-0.5 % OP SOLN
OPHTHALMIC | Status: DC | PRN
  Administered 2021-03-04: 1 [drp] via OPHTHALMIC

## 2021-03-04 MED ORDER — SIGHTPATH DOSE#1 BSS IO SOLN
INTRAOCULAR | Status: DC | PRN
  Administered 2021-03-04: 92 mL via OPHTHALMIC

## 2021-03-04 MED ORDER — ARMC OPHTHALMIC DILATING DROPS
1.0000 "application " | OPHTHALMIC | Status: DC | PRN
  Administered 2021-03-04 (×3): 1 via OPHTHALMIC

## 2021-03-04 MED ORDER — FENTANYL CITRATE (PF) 100 MCG/2ML IJ SOLN
INTRAMUSCULAR | Status: DC | PRN
  Administered 2021-03-04: 50 ug via INTRAVENOUS

## 2021-03-04 MED ORDER — TETRACAINE HCL 0.5 % OP SOLN
1.0000 [drp] | OPHTHALMIC | Status: DC | PRN
  Administered 2021-03-04 (×3): 1 [drp] via OPHTHALMIC

## 2021-03-04 MED ORDER — MOXIFLOXACIN HCL 0.5 % OP SOLN
OPHTHALMIC | Status: DC | PRN
  Administered 2021-03-04: 0.2 mL via OPHTHALMIC

## 2021-03-04 MED ORDER — SIGHTPATH DOSE#1 BSS IO SOLN
INTRAOCULAR | Status: DC | PRN
  Administered 2021-03-04: 15 mL via INTRAOCULAR

## 2021-03-04 MED ORDER — SIGHTPATH DOSE#1 NA CHONDROIT SULF-NA HYALURON 40-17 MG/ML IO SOLN
INTRAOCULAR | Status: DC | PRN
  Administered 2021-03-04: 1 mL via INTRAOCULAR

## 2021-03-04 MED ORDER — SIGHTPATH DOSE#1 BSS IO SOLN
INTRAOCULAR | Status: DC | PRN
  Administered 2021-03-04: 2 mL

## 2021-03-04 MED ORDER — OXYCODONE HCL 5 MG PO TABS: 5.0000 mg | ORAL_TABLET | Freq: Once | ORAL | Status: DC | PRN

## 2021-03-04 MED ORDER — MIDAZOLAM HCL 2 MG/2ML IJ SOLN
INTRAMUSCULAR | Status: DC | PRN
  Administered 2021-03-04: 1 mg via INTRAVENOUS

## 2021-03-04 MED ORDER — OXYCODONE HCL 5 MG/5ML PO SOLN: 5.0000 mg | Freq: Once | ORAL | Status: DC | PRN

## 2021-03-04 SURGICAL SUPPLY — 15 items
CANNULA ANT/CHMB 27GA (MISCELLANEOUS) IMPLANT
GLOVE SURG ENC TEXT LTX SZ8 (GLOVE) ×2 IMPLANT
GLOVE SURG TRIUMPH 8.0 PF LTX (GLOVE) ×2 IMPLANT
GOWN STRL REUS W/ TWL LRG LVL3 (GOWN DISPOSABLE) ×2 IMPLANT
GOWN STRL REUS W/TWL LRG LVL3 (GOWN DISPOSABLE) ×4
LENS IOL RAYNER 19.5 (Intraocular Lens) ×2 IMPLANT
LENS IOL RAYONE EMV 19.5 (Intraocular Lens) ×1 IMPLANT
MARKER SKIN DUAL TIP RULER LAB (MISCELLANEOUS) IMPLANT
NEEDLE FILTER BLUNT 18X 1/2SAF (NEEDLE) ×1
NEEDLE FILTER BLUNT 18X1 1/2 (NEEDLE) ×1 IMPLANT
PACK EYE AFTER SURG (MISCELLANEOUS) IMPLANT
SYR 3ML LL SCALE MARK (SYRINGE) ×2 IMPLANT
SYR TB 1ML LUER SLIP (SYRINGE) ×2 IMPLANT
WATER STERILE IRR 250ML POUR (IV SOLUTION) ×2 IMPLANT
WIPE NON LINTING 3.25X3.25 (MISCELLANEOUS) IMPLANT

## 2021-03-04 NOTE — Anesthesia Postprocedure Evaluation (Signed)
Anesthesia Post Note  Patient: Cole Collins  Procedure(s) Performed: CATARACT EXTRACTION PHACO AND INTRAOCULAR LENS PLACEMENT (IOC) RIGHT RAYNOR LENS 19.58 01:31.7 (Right: Eye)     Patient location during evaluation: PACU Anesthesia Type: MAC Level of consciousness: awake and alert Pain management: pain level controlled Vital Signs Assessment: post-procedure vital signs reviewed and stable Respiratory status: spontaneous breathing, nonlabored ventilation, respiratory function stable and patient connected to nasal cannula oxygen Cardiovascular status: stable and blood pressure returned to baseline Postop Assessment: no apparent nausea or vomiting Anesthetic complications: no   No notable events documented.  Fidel Levy

## 2021-03-04 NOTE — Anesthesia Preprocedure Evaluation (Addendum)
Anesthesia Evaluation  Patient identified by MRN, date of birth, ID band Patient awake    Reviewed: NPO status   History of Anesthesia Complications Negative for: history of anesthetic complications  Airway Mallampati: II  TM Distance: >3 FB Neck ROM: full    Dental no notable dental hx.    Pulmonary neg pulmonary ROS, former smoker,    Pulmonary exam normal        Cardiovascular Exercise Tolerance: Good hypertension, +CHF (ef=20-30%;)  Normal cardiovascular exam+ Valvular Problems/Murmurs (Nonrheumatic aortic valve insufficiency;)   cards: 02/2021: fath; "Cath revealed severe three-vessel coronary disease as per above have referred to thoracic surgery"  echo: 04/2020:Left ventricular ejection fraction, by estimation, is 30 to 35%. The  left ventricle has moderately decreased function. The left ventricle  demonstrates global hypokinesis. ;  cath: 07/2020:  1.  Three-vessel coronary artery disease, with left dominant system, 80% stenosis mid LAD, occluded OM1, segmental 50/75% stenosis proximal/mid RCA 2.  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 Cardiologist believes pt to be stable for elective cataract, per Dr. Junious Dresser.   Neuro/Psych negative neurological ROS  negative psych ROS   GI/Hepatic negative GI ROS, Neg liver ROS,   Endo/Other  diabetes  Renal/GU negative Renal ROS  negative genitourinary   Musculoskeletal   Abdominal   Peds  Hematology negative hematology ROS (+)   Anesthesia Other Findings Recent Acute HF, resolving with med mgmt, with referral for non-emergent revascularization. Cardiologist believes pt to be stable for elective cataract, per Dr. Junious Dresser 10/28  Reproductive/Obstetrics                            Anesthesia Physical Anesthesia Plan  ASA: 3  Anesthesia Plan: MAC   Post-op Pain Management:    Induction:   PONV Risk Score and Plan: 1 and Midazolam  Airway Management Planned:   Additional Equipment:   Intra-op Plan:   Post-operative Plan:   Informed Consent: I have reviewed the patients History and Physical, chart, labs and discussed the procedure including the risks, benefits and alternatives for the proposed anesthesia with the patient or authorized representative who has indicated his/her understanding and acceptance.       Plan Discussed with: CRNA  Anesthesia Plan Comments:        Anesthesia Quick Evaluation

## 2021-03-04 NOTE — Transfer of Care (Signed)
Immediate Anesthesia Transfer of Care Note  Patient: Cole Collins  Procedure(s) Performed: CATARACT EXTRACTION PHACO AND INTRAOCULAR LENS PLACEMENT (IOC) RIGHT RAYNOR LENS 19.58 01:31.7 (Right: Eye)  Patient Location: PACU  Anesthesia Type: MAC  Level of Consciousness: awake, alert  and patient cooperative  Airway and Oxygen Therapy: Patient Spontanous Breathing and Patient connected to supplemental oxygen  Post-op Assessment: Post-op Vital signs reviewed, Patient's Cardiovascular Status Stable, Respiratory Function Stable, Patent Airway and No signs of Nausea or vomiting  Post-op Vital Signs: Reviewed and stable  Complications: No notable events documented.

## 2021-03-04 NOTE — Op Note (Signed)
PREOPERATIVE DIAGNOSIS:  Nuclear sclerotic cataract of the right eye.   POSTOPERATIVE DIAGNOSIS:  Cataract   OPERATIVE PROCEDURE:ORPROCALL@   SURGEON:  Cole Manila, MD.   ANESTHESIA:  Anesthesiologist: Orrin Brigham, MD CRNA: Michaele Offer, CRNA  1.      Managed anesthesia care. 2.      0.91ml of Shugarcaine was instilled in the eye following the paracentesis.   COMPLICATIONS:  None.   TECHNIQUE:   Stop and chop   DESCRIPTION OF PROCEDURE:  The patient was examined and consented in the preoperative holding area where the aforementioned topical anesthesia was applied to the right eye and then brought back to the Operating Room where the right eye was prepped and draped in the usual sterile ophthalmic fashion and a lid speculum was placed. A paracentesis was created with the side port blade and the anterior chamber was filled with viscoelastic. A near clear corneal incision was performed with the steel keratome. A continuous curvilinear capsulorrhexis was performed with a cystotome followed by the capsulorrhexis forceps. Hydrodissection and hydrodelineation were carried out with BSS on a blunt cannula. The lens was removed in a stop and chop  technique and the remaining cortical material was removed with the irrigation-aspiration handpiece. The capsular bag was inflated with viscoelastic and the Technis ZCB00  lens was placed in the capsular bag without complication. The remaining viscoelastic was removed from the eye with the irrigation-aspiration handpiece. The wounds were hydrated. The anterior chamber was flushed with BSS and the eye was inflated to physiologic pressure. 0.69ml of Vigamox was placed in the anterior chamber. The wounds were found to be water tight. The eye was dressed with Combigan. The patient was given protective glasses to wear throughout the day and a shield with which to sleep tonight. The patient was also given drops with which to begin a drop regimen today and will  follow-up with me in one day. Implant Name Type Inv. Item Serial No. Manufacturer Lot No. LRB No. Used Action  LENS IOL RAYNER 19.5 - S19 Intraocular Lens LENS IOL RAYNER 19.5 19 [employer] [id number] Right 1 Implanted   Procedure(s): CATARACT EXTRACTION PHACO AND INTRAOCULAR LENS PLACEMENT (IOC) RIGHT RAYNOR LENS 19.58 01:31.7 (Right)  Electronically signed: Galen Collins 03/04/2021 12:19 PM

## 2021-03-04 NOTE — H&P (Signed)
[facility]   Primary Care Physician:  Gracelyn Nurse, MD Ophthalmologist: Dr. Willey Blade  Pre-Procedure History & Physical: HPI:  Cole Collins is a 70 y.o. male here for cataract surgery.   Past Medical History:  Diagnosis Date   CHF (congestive heart failure) (HCC)    Diabetes (HCC) 2019   Hypertension 2019    Past Surgical History:  Procedure Laterality Date   LEFT HEART CATH AND CORONARY ANGIOGRAPHY Bilateral 07/23/2020   Procedure: LEFT HEART CATH AND CORONARY ANGIOGRAPHY;  Surgeon: Marcina Millard, MD;  Location: [facility];  Service: Cardiovascular;  Laterality: Bilateral;    Prior to Admission medications   Medication Sig Start Date End Date Taking? Authorizing Provider  carvedilol (COREG) 12.5 MG tablet Take 12.5 mg by mouth 2 (two) times daily with a meal.   Yes [provider]  ENTRESTO 24-26 MG Take 1 tablet by mouth 2 (two) times daily. 06/27/20  Yes [provider]  furosemide (LASIX) 20 MG tablet Take 1 tablet (20 mg total) by mouth daily. Patient taking differently: Take 40 mg by mouth daily. 04/11/20  Yes Wouk, Wilfred Curtis, MD  glipiZIDE (GLUCOTROL) 5 MG tablet Take 5 mg by mouth daily before breakfast. 03/31/20  Yes [provider]  potassium chloride SA (KLOR-CON) 20 MEQ tablet Take 1 tablet (20 mEq total) by mouth daily. 04/11/20  Yes Wouk, Wilfred Curtis, MD  traZODone (DESYREL) 50 MG tablet Take 50 mg by mouth at bedtime. 06/14/20  Yes [provider]    Allergies as of 01/31/2021   (No Known Allergies)    Family History  Problem Relation Age of Onset   Dementia Mother    Lymphoma Father     Social History   Socioeconomic History   Marital status: Widowed    Spouse name: Not on file   Number of children: 2   Years of education: Not on file   Highest education level: Not on file  Occupational History   Occupation: Driver Ed Runner, broadcasting/film/video  Tobacco Use   Smoking status: Former    Packs/day:  0.50    Years: 50.00    Pack years: 25.00    Types: Cigarettes    Quit date: 04/06/2020    Years since quitting: 0.9   Smokeless tobacco: Never  Vaping Use   Vaping Use: Never used  Substance and Sexual Activity   Alcohol use: Not Currently   Drug use: Never   Sexual activity: Not on file  Other Topics Concern   Not on file  Social History Narrative   Lives by himself.    Social Determinants of Health   Financial Resource Strain: Not on file  Food Insecurity: Not on file  Transportation Needs: Not on file  Physical Activity: Not on file  Stress: Not on file  Social Connections: Not on file  Intimate Partner Violence: Not on file    Review of Systems: See HPI, otherwise negative ROS  Physical Exam: BP (!) 142/84   Pulse 84   Temp 97.9 F (36.6 C) (Temporal)   Resp 16   Ht 5\' 10"  (1.778 m)   Wt 85.3 kg   SpO2 98%   BMI 26.98 kg/m  General:   Alert, cooperative in NAD Head:  Normocephalic and atraumatic. Respiratory:  Normal work of breathing. Cardiovascular:  RRR  Impression/Plan: Cole Collins is here for cataract surgery.  Risks, benefits, limitations, and alternatives regarding cataract surgery have been reviewed with the patient.  Questions  have been answered.  All parties agreeable.   Galen Manila, MD  03/04/2021, 11:50 AM

## 2021-03-05 ENCOUNTER — Encounter: Payer: Self-pay | Admitting: Ophthalmology

## 2021-03-10 ENCOUNTER — Encounter: Payer: Self-pay | Admitting: Ophthalmology

## 2021-03-10 NOTE — Discharge Instructions (Signed)

## 2021-03-18 ENCOUNTER — Ambulatory Visit: Payer: Medicare HMO | Admitting: Anesthesiology

## 2021-03-18 ENCOUNTER — Encounter: Payer: Self-pay | Admitting: Ophthalmology

## 2021-03-18 ENCOUNTER — Ambulatory Visit: Admit: 2021-03-18 | Payer: Medicare HMO | Admitting: Ophthalmology

## 2021-03-18 ENCOUNTER — Ambulatory Visit
Admission: RE | Admit: 2021-03-18 | Discharge: 2021-03-18 | Disposition: A | Discharge status: Home / Self Care | Payer: Medicare HMO | Attending: Ophthalmology | Admitting: Ophthalmology

## 2021-03-18 ENCOUNTER — Other Ambulatory Visit: Payer: Self-pay

## 2021-03-18 ENCOUNTER — Encounter
Admission: RE | Disposition: A | Discharge status: Home / Self Care | Payer: Self-pay | Source: Home / Self Care | Attending: Ophthalmology

## 2021-03-18 DIAGNOSIS — Z87891 Personal history of nicotine dependence: Secondary | ICD-10-CM | POA: Diagnosis not present

## 2021-03-18 DIAGNOSIS — H2512 Age-related nuclear cataract, left eye: Secondary | ICD-10-CM | POA: Insufficient documentation

## 2021-03-18 DIAGNOSIS — E1136 Type 2 diabetes mellitus with diabetic cataract: Secondary | ICD-10-CM | POA: Insufficient documentation

## 2021-03-18 DIAGNOSIS — I10 Essential (primary) hypertension: Secondary | ICD-10-CM | POA: Diagnosis not present

## 2021-03-18 HISTORY — PX: CATARACT EXTRACTION W/PHACO: SHX586

## 2021-03-18 LAB — GLUCOSE, CAPILLARY
Glucose-Capillary: 133 mg/dL — ABNORMAL HIGH (ref 70–99)
Glucose-Capillary: 124 mg/dL — ABNORMAL HIGH (ref 70–99)

## 2021-03-18 SURGERY — PHACOEMULSIFICATION, CATARACT, WITH IOL INSERTION
Anesthesia: Monitor Anesthesia Care | Site: Eye | Laterality: Left

## 2021-03-18 SURGERY — PHACOEMULSIFICATION, CATARACT, WITH IOL INSERTION
Anesthesia: Topical | Laterality: Left

## 2021-03-18 MED ORDER — FENTANYL CITRATE (PF) 100 MCG/2ML IJ SOLN
INTRAMUSCULAR | Status: DC | PRN
  Administered 2021-03-18: 50 ug via INTRAVENOUS

## 2021-03-18 MED ORDER — ACETAMINOPHEN 325 MG PO TABS: 325.0000 mg | ORAL_TABLET | ORAL | Status: DC | PRN

## 2021-03-18 MED ORDER — SIGHTPATH DOSE#1 BSS IO SOLN
INTRAOCULAR | Status: DC | PRN
  Administered 2021-03-18: 76 mL via OPHTHALMIC

## 2021-03-18 MED ORDER — LACTATED RINGERS IV SOLN: INTRAVENOUS | Status: DC

## 2021-03-18 MED ORDER — SIGHTPATH DOSE#1 NA CHONDROIT SULF-NA HYALURON 40-17 MG/ML IO SOLN
INTRAOCULAR | Status: DC | PRN
  Administered 2021-03-18: 1 mL via INTRAOCULAR

## 2021-03-18 MED ORDER — BRIMONIDINE TARTRATE-TIMOLOL 0.2-0.5 % OP SOLN
OPHTHALMIC | Status: DC | PRN
  Administered 2021-03-18: 1 [drp] via OPHTHALMIC

## 2021-03-18 MED ORDER — MIDAZOLAM HCL 2 MG/2ML IJ SOLN
INTRAMUSCULAR | Status: DC | PRN
  Administered 2021-03-18: 1 mg via INTRAVENOUS

## 2021-03-18 MED ORDER — ACETAMINOPHEN 160 MG/5ML PO SOLN: 325.0000 mg | ORAL | Status: DC | PRN

## 2021-03-18 MED ORDER — ARMC OPHTHALMIC DILATING DROPS
1.0000 "application " | OPHTHALMIC | Status: DC | PRN
  Administered 2021-03-18 (×3): 1 via OPHTHALMIC

## 2021-03-18 MED ORDER — CEFUROXIME OPHTHALMIC INJECTION 1 MG/0.1 ML
INJECTION | OPHTHALMIC | Status: DC | PRN
  Administered 2021-03-18: 0.1 mL via INTRACAMERAL

## 2021-03-18 MED ORDER — SIGHTPATH DOSE#1 BSS IO SOLN
INTRAOCULAR | Status: DC | PRN
  Administered 2021-03-18: 15 mL

## 2021-03-18 MED ORDER — SIGHTPATH DOSE#1 BSS IO SOLN
INTRAOCULAR | Status: DC | PRN
  Administered 2021-03-18: .5 mL

## 2021-03-18 MED ORDER — TETRACAINE HCL 0.5 % OP SOLN
1.0000 [drp] | OPHTHALMIC | Status: DC | PRN
  Administered 2021-03-18 (×3): 1 [drp] via OPHTHALMIC

## 2021-03-18 SURGICAL SUPPLY — 18 items
CANNULA ANT/CHMB 27GA (MISCELLANEOUS) IMPLANT
GLOVE SURG ENC TEXT LTX SZ8 (GLOVE) ×3 IMPLANT
GLOVE SURG TRIUMPH 8.0 PF LTX (GLOVE) ×3 IMPLANT
GOWN STRL REUS W/ TWL LRG LVL3 (GOWN DISPOSABLE) ×2 IMPLANT
GOWN STRL REUS W/TWL LRG LVL3 (GOWN DISPOSABLE) ×6
LENS IOL RAYNER 20.0 (Intraocular Lens) ×3 IMPLANT
LENS IOL RAYONE EMV 20.0 (Intraocular Lens) ×1 IMPLANT
MARKER SKIN DUAL TIP RULER LAB (MISCELLANEOUS) IMPLANT
NEEDLE FILTER BLUNT 18X 1/2SAF (NEEDLE) ×2
NEEDLE FILTER BLUNT 18X1 1/2 (NEEDLE) ×1 IMPLANT
PACK EYE AFTER SURG (MISCELLANEOUS) IMPLANT
RING MALYGIN (MISCELLANEOUS) IMPLANT
SUT ETHILON 10-0 CS-B-6CS-B-6 (SUTURE)
SUTURE EHLN 10-0 CS-B-6CS-B-6 (SUTURE) IMPLANT
SYR 3ML LL SCALE MARK (SYRINGE) ×3 IMPLANT
SYR TB 1ML LUER SLIP (SYRINGE) ×3 IMPLANT
WATER STERILE IRR 250ML POUR (IV SOLUTION) ×3 IMPLANT
WIPE NON LINTING 3.25X3.25 (MISCELLANEOUS) IMPLANT

## 2021-03-18 NOTE — Anesthesia Postprocedure Evaluation (Signed)
Anesthesia Post Note  Patient: Cole Collins  Procedure(s) Performed: CATARACT EXTRACTION PHACO AND INTRAOCULAR LENS PLACEMENT (IOC) LEFT RAYNER LENS (Left: Eye)     Patient location during evaluation: PACU Anesthesia Type: MAC Level of consciousness: awake and alert Pain management: pain level controlled Vital Signs Assessment: post-procedure vital signs reviewed and stable Respiratory status: spontaneous breathing, nonlabored ventilation, respiratory function stable and patient connected to nasal cannula oxygen Cardiovascular status: stable and blood pressure returned to baseline Postop Assessment: no apparent nausea or vomiting Anesthetic complications: no   No notable events documented.  Trecia Rogers

## 2021-03-18 NOTE — Anesthesia Preprocedure Evaluation (Signed)
Anesthesia Evaluation  Patient identified by MRN, date of birth, ID band Patient awake    Reviewed: Allergy & Precautions, H&P , NPO status , Patient's Chart, lab work & pertinent test results, reviewed documented beta blocker date and time   Airway Mallampati: II  TM Distance: >3 FB Neck ROM: full    Dental no notable dental hx.    Pulmonary neg pulmonary ROS, former smoker,    Pulmonary exam normal breath sounds clear to auscultation       Cardiovascular Exercise Tolerance: Good hypertension,  Rhythm:regular Rate:Normal     Neuro/Psych negative neurological ROS  negative psych ROS   GI/Hepatic negative GI ROS, Neg liver ROS,   Endo/Other  negative endocrine ROSdiabetes, Type 2  Renal/GU negative Renal ROS  negative genitourinary   Musculoskeletal   Abdominal   Peds  Hematology negative hematology ROS (+)   Anesthesia Other Findings   Reproductive/Obstetrics negative OB ROS                             Anesthesia Physical Anesthesia Plan  ASA: 2  Anesthesia Plan: MAC   Post-op Pain Management:    Induction:   PONV Risk Score and Plan:   Airway Management Planned:   Additional Equipment:   Intra-op Plan:   Post-operative Plan:   Informed Consent: I have reviewed the patients History and Physical, chart, labs and discussed the procedure including the risks, benefits and alternatives for the proposed anesthesia with the patient or authorized representative who has indicated his/her understanding and acceptance.     Dental Advisory Given  Plan Discussed with: CRNA and Anesthesiologist  Anesthesia Plan Comments:         Anesthesia Quick Evaluation

## 2021-03-18 NOTE — Anesthesia Procedure Notes (Signed)
Procedure Name: MAC Date/Time: 03/18/2021 9:31 AM Performed by: Jeannene Patella, CRNA Pre-anesthesia Checklist: Patient identified, Emergency Drugs available, Suction available, Timeout performed and Patient being monitored Patient Re-evaluated:Patient Re-evaluated prior to induction Oxygen Delivery Method: Nasal cannula Placement Confirmation: positive ETCO2

## 2021-03-18 NOTE — H&P (Signed)
[facility]   Primary Care Physician:  Gracelyn Nurse, MD Ophthalmologist: Dr. Druscilla Brownie  Pre-Procedure History & Physical: HPI:  Cole Collins is a 70 y.o. male here for cataract surgery.   Past Medical History:  Diagnosis Date   CHF (congestive heart failure) (HCC)    Diabetes (HCC) 2019   Hypertension 2019    Past Surgical History:  Procedure Laterality Date   CATARACT EXTRACTION W/PHACO Right 03/04/2021   Procedure: CATARACT EXTRACTION PHACO AND INTRAOCULAR LENS PLACEMENT (IOC) RIGHT RAYNOR LENS 19.58 01:31.7;  Surgeon: Galen Manila, MD;  Location: [facility];  Service: Ophthalmology;  Laterality: Right;   LEFT HEART CATH AND CORONARY ANGIOGRAPHY Bilateral 07/23/2020   Procedure: LEFT HEART CATH AND CORONARY ANGIOGRAPHY;  Surgeon: Marcina Millard, MD;  Location: [facility];  Service: Cardiovascular;  Laterality: Bilateral;    Prior to Admission medications   Medication Sig Start Date End Date Taking? Authorizing Provider  carvedilol (COREG) 12.5 MG tablet Take 12.5 mg by mouth 2 (two) times daily with a meal.   Yes [provider]  ENTRESTO 24-26 MG Take 1 tablet by mouth 2 (two) times daily. 06/27/20  Yes [provider]  furosemide (LASIX) 20 MG tablet Take 1 tablet (20 mg total) by mouth daily. Patient taking differently: Take 40 mg by mouth daily. 04/11/20  Yes Wouk, Wilfred Curtis, MD  glipiZIDE (GLUCOTROL) 5 MG tablet Take 5 mg by mouth daily before breakfast. 03/31/20  Yes [provider]  potassium chloride SA (KLOR-CON) 20 MEQ tablet Take 1 tablet (20 mEq total) by mouth daily. 04/11/20  Yes Wouk, Wilfred Curtis, MD  traZODone (DESYREL) 50 MG tablet Take 50 mg by mouth at bedtime. 06/14/20  Yes [provider]    Allergies as of 02/27/2021   (No Known Allergies)    Family History  Problem Relation Age of Onset   Dementia Mother    Lymphoma Father     Social History   Socioeconomic History    Marital status: Widowed    Spouse name: Not on file   Number of children: 2   Years of education: Not on file   Highest education level: Not on file  Occupational History   Occupation: Driver Ed Runner, broadcasting/film/video  Tobacco Use   Smoking status: Former    Packs/day: 0.50    Years: 50.00    Pack years: 25.00    Types: Cigarettes    Quit date: 04/06/2020    Years since quitting: 0.9   Smokeless tobacco: Never  Vaping Use   Vaping Use: Never used  Substance and Sexual Activity   Alcohol use: Not Currently   Drug use: Never   Sexual activity: Not on file  Other Topics Concern   Not on file  Social History Narrative   Lives by himself.    Social Determinants of Health   Financial Resource Strain: Not on file  Food Insecurity: Not on file  Transportation Needs: Not on file  Physical Activity: Not on file  Stress: Not on file  Social Connections: Not on file  Intimate Partner Violence: Not on file    Review of Systems: See HPI, otherwise negative ROS  Physical Exam: BP (!) 146/78   Pulse 73   Temp (!) 97.2 F (36.2 C) (Temporal)   Resp 18   Ht 5\' 10"  (1.778 m)   Wt 86.3 kg   SpO2 98%   BMI 27.29 kg/m  General:   Alert, cooperative in NAD Head:  Normocephalic and atraumatic. Respiratory:  Normal work of breathing. Cardiovascular:  RRR  Impression/Plan: Cole Collins is here for cataract surgery.  Risks, benefits, limitations, and alternatives regarding cataract surgery have been reviewed with the patient.  Questions have been answered.  All parties agreeable.   Galen Manila, MD  03/18/2021, 9:20 AM

## 2021-03-18 NOTE — Op Note (Signed)
PREOPERATIVE DIAGNOSIS:  Nuclear sclerotic cataract of the left eye.   POSTOPERATIVE DIAGNOSIS:  Nuclear sclerotic cataract of the left eye.   OPERATIVE PROCEDURE:ORPROCALL@   SURGEON:  Galen Manila, MD.   ANESTHESIA:  Anesthesiologist: Baxter Flattery, MD CRNA: Jinny Blossom, CRNA  1.      Managed anesthesia care. 2.     0.48ml of Shugarcaine was instilled following the paracentesis   COMPLICATIONS:  None.   TECHNIQUE:   Stop and chop   DESCRIPTION OF PROCEDURE:  The patient was examined and consented in the preoperative holding area where the aforementioned topical anesthesia was applied to the left eye and then brought back to the Operating Room where the left eye was prepped and draped in the usual sterile ophthalmic fashion and a lid speculum was placed. A paracentesis was created with the side port blade and the anterior chamber was filled with viscoelastic. A near clear corneal incision was performed with the steel keratome. A continuous curvilinear capsulorrhexis was performed with a cystotome followed by the capsulorrhexis forceps. Hydrodissection and hydrodelineation were carried out with BSS on a blunt cannula. The lens was removed in a stop and chop  technique and the remaining cortical material was removed with the irrigation-aspiration handpiece. The capsular bag was inflated with viscoelastic and the Technis ZCB00 lens was placed in the capsular bag without complication. The remaining viscoelastic was removed from the eye with the irrigation-aspiration handpiece. The wounds were hydrated. The anterior chamber was flushed with BSS and the eye was inflated to physiologic pressure. 0.8ml Vigamox was placed in the anterior chamber. The wounds were found to be water tight. The eye was dressed with Combigan. The patient was given protective glasses to wear throughout the day and a shield with which to sleep tonight. The patient was also given drops with which to begin a drop  regimen today and will follow-up with me in one day. Implant Name Type Inv. Item Serial No. Manufacturer Lot No. LRB No. Used Action  RAYONE EMV [place] IOL LENS    [employer] [id number] Left 1 Implanted    Procedure(s) with comments: CATARACT EXTRACTION PHACO AND INTRAOCULAR LENS PLACEMENT (IOC) LEFT RAYNER LENS (Left) - 15.74 1:21.8  Electronically signed: Galen Manila 03/18/2021 9:49 AM

## 2021-03-18 NOTE — Transfer of Care (Signed)
Immediate Anesthesia Transfer of Care Note  Patient: Cole Collins  Procedure(s) Performed: CATARACT EXTRACTION PHACO AND INTRAOCULAR LENS PLACEMENT (IOC) LEFT RAYNER LENS (Left: Eye)  Patient Location: PACU  Anesthesia Type: MAC  Level of Consciousness: awake, alert  and patient cooperative  Airway and Oxygen Therapy: Patient Spontanous Breathing and Patient connected to supplemental oxygen  Post-op Assessment: Post-op Vital signs reviewed, Patient's Cardiovascular Status Stable, Respiratory Function Stable, Patent Airway and No signs of Nausea or vomiting  Post-op Vital Signs: Reviewed and stable  Complications: No notable events documented.

## 2021-03-19 ENCOUNTER — Encounter: Payer: Self-pay | Admitting: Ophthalmology

## 2022-02-15 ENCOUNTER — Emergency Department: Payer: Medicare HMO

## 2022-02-15 DIAGNOSIS — Z9181 History of falling: Secondary | ICD-10-CM

## 2022-02-15 DIAGNOSIS — Z807 Family history of other malignant neoplasms of lymphoid, hematopoietic and related tissues: Secondary | ICD-10-CM

## 2022-02-15 DIAGNOSIS — Z7982 Long term (current) use of aspirin: Secondary | ICD-10-CM

## 2022-02-15 DIAGNOSIS — U071 COVID-19: Secondary | ICD-10-CM | POA: Diagnosis present

## 2022-02-15 DIAGNOSIS — R7402 Elevation of levels of lactic acid dehydrogenase (LDH): Secondary | ICD-10-CM | POA: Diagnosis present

## 2022-02-15 DIAGNOSIS — I5023 Acute on chronic systolic (congestive) heart failure: Secondary | ICD-10-CM | POA: Diagnosis not present

## 2022-02-15 DIAGNOSIS — R748 Abnormal levels of other serum enzymes: Secondary | ICD-10-CM | POA: Diagnosis present

## 2022-02-15 DIAGNOSIS — I441 Atrioventricular block, second degree: Principal | ICD-10-CM | POA: Diagnosis present

## 2022-02-15 DIAGNOSIS — E785 Hyperlipidemia, unspecified: Secondary | ICD-10-CM | POA: Diagnosis present

## 2022-02-15 DIAGNOSIS — I420 Dilated cardiomyopathy: Secondary | ICD-10-CM | POA: Diagnosis present

## 2022-02-15 DIAGNOSIS — I2489 Other forms of acute ischemic heart disease: Secondary | ICD-10-CM | POA: Diagnosis present

## 2022-02-15 DIAGNOSIS — E876 Hypokalemia: Secondary | ICD-10-CM | POA: Diagnosis present

## 2022-02-15 DIAGNOSIS — Z87891 Personal history of nicotine dependence: Secondary | ICD-10-CM

## 2022-02-15 DIAGNOSIS — Z79899 Other long term (current) drug therapy: Secondary | ICD-10-CM

## 2022-02-15 DIAGNOSIS — R531 Weakness: Secondary | ICD-10-CM | POA: Diagnosis not present

## 2022-02-15 DIAGNOSIS — Z28310 Unvaccinated for covid-19: Secondary | ICD-10-CM

## 2022-02-15 DIAGNOSIS — Z7984 Long term (current) use of oral hypoglycemic drugs: Secondary | ICD-10-CM

## 2022-02-15 DIAGNOSIS — E1165 Type 2 diabetes mellitus with hyperglycemia: Secondary | ICD-10-CM | POA: Diagnosis present

## 2022-02-15 DIAGNOSIS — I110 Hypertensive heart disease with heart failure: Secondary | ICD-10-CM | POA: Diagnosis present

## 2022-02-15 DIAGNOSIS — I2510 Atherosclerotic heart disease of native coronary artery without angina pectoris: Secondary | ICD-10-CM | POA: Diagnosis present

## 2022-02-15 DIAGNOSIS — R001 Bradycardia, unspecified: Secondary | ICD-10-CM | POA: Diagnosis present

## 2022-02-15 LAB — URINALYSIS, ROUTINE W REFLEX MICROSCOPIC
Bilirubin Urine: NEGATIVE
Glucose, UA: NEGATIVE mg/dL
Ketones, ur: 20 mg/dL — AB
Leukocytes,Ua: NEGATIVE
Nitrite: NEGATIVE
Protein, ur: >=300 mg/dL — AB
Specific Gravity, Urine: 1.027 (ref 1.005–1.030)
pH: 5 (ref 5.0–8.0)

## 2022-02-15 LAB — CBC WITH DIFFERENTIAL/PLATELET
Abs Immature Granulocytes: 0.01 10*3/uL (ref 0.00–0.07)
Basophils Absolute: 0 10*3/uL (ref 0.0–0.1)
Basophils Relative: 0 %
Eosinophils Absolute: 0 10*3/uL (ref 0.0–0.5)
Eosinophils Relative: 0 %
HCT: 49.3 % (ref 39.0–52.0)
Hemoglobin: 16.3 g/dL (ref 13.0–17.0)
Immature Granulocytes: 0 %
Lymphocytes Relative: 12 %
Lymphs Abs: 0.9 10*3/uL (ref 0.7–4.0)
MCH: 30.2 pg (ref 26.0–34.0)
MCHC: 33.1 g/dL (ref 30.0–36.0)
MCV: 91.3 fL (ref 80.0–100.0)
Monocytes Absolute: 1.3 10*3/uL — ABNORMAL HIGH (ref 0.1–1.0)
Monocytes Relative: 19 %
Neutro Abs: 4.7 10*3/uL (ref 1.7–7.7)
Neutrophils Relative %: 69 %
Platelets: 167 10*3/uL (ref 150–400)
RBC: 5.4 MIL/uL (ref 4.22–5.81)
RDW: 13.6 % (ref 11.5–15.5)
WBC: 6.9 10*3/uL (ref 4.0–10.5)
nRBC: 0 % (ref 0.0–0.2)

## 2022-02-15 LAB — COMPREHENSIVE METABOLIC PANEL
ALT: 26 U/L (ref 0–44)
AST: 47 U/L — ABNORMAL HIGH (ref 15–41)
Albumin: 4.4 g/dL (ref 3.5–5.0)
Alkaline Phosphatase: 60 U/L (ref 38–126)
Anion gap: 11 (ref 5–15)
BUN: 18 mg/dL (ref 8–23)
CO2: 25 mmol/L (ref 22–32)
Calcium: 9.2 mg/dL (ref 8.9–10.3)
Chloride: 103 mmol/L (ref 98–111)
Creatinine, Ser: 1.14 mg/dL (ref 0.61–1.24)
GFR, Estimated: >60 mL/min (ref >60)
Glucose, Bld: 107 mg/dL — ABNORMAL HIGH (ref 70–99)
Potassium: 3.3 mmol/L — ABNORMAL LOW (ref 3.5–5.1)
Sodium: 139 mmol/L (ref 135–145)
Total Bilirubin: 1.4 mg/dL — ABNORMAL HIGH (ref 0.3–1.2)
Total Protein: 8.2 g/dL — ABNORMAL HIGH (ref 6.5–8.1)

## 2022-02-15 LAB — RESP PANEL BY RT-PCR (FLU A&B, COVID) ARPGX2
Influenza A by PCR: NEGATIVE
Influenza B by PCR: NEGATIVE
SARS Coronavirus 2 by RT PCR: POSITIVE — AB

## 2022-02-15 LAB — BRAIN NATRIURETIC PEPTIDE: B Natriuretic Peptide: 2610.1 pg/mL — ABNORMAL HIGH (ref 0.0–100.0)

## 2022-02-15 LAB — TROPONIN I (HIGH SENSITIVITY): Troponin I (High Sensitivity): 33 ng/L — ABNORMAL HIGH (ref <18)

## 2022-02-15 NOTE — ED Triage Notes (Signed)
To triage via [facility] from home with weakness and unsteady gait.  Felt unsteady and weak x several days. Denies injury, pain, chest pain, increased shortness of breath or other c/o.

## 2022-02-16 ENCOUNTER — Inpatient Hospital Stay
Admission: EM | Admit: 2022-02-16 | Discharge: 2022-02-18 | DRG: 308 | Disposition: A | Discharge status: Home / Self Care | Payer: Medicare HMO | Attending: Internal Medicine | Admitting: Internal Medicine

## 2022-02-16 ENCOUNTER — Inpatient Hospital Stay: Payer: Medicare HMO

## 2022-02-16 ENCOUNTER — Emergency Department: Payer: Medicare HMO

## 2022-02-16 ENCOUNTER — Other Ambulatory Visit: Payer: Self-pay

## 2022-02-16 DIAGNOSIS — I420 Dilated cardiomyopathy: Secondary | ICD-10-CM | POA: Diagnosis present

## 2022-02-16 DIAGNOSIS — E1165 Type 2 diabetes mellitus with hyperglycemia: Secondary | ICD-10-CM | POA: Diagnosis present

## 2022-02-16 DIAGNOSIS — Z7984 Long term (current) use of oral hypoglycemic drugs: Secondary | ICD-10-CM | POA: Diagnosis not present

## 2022-02-16 DIAGNOSIS — Z87891 Personal history of nicotine dependence: Secondary | ICD-10-CM | POA: Diagnosis not present

## 2022-02-16 DIAGNOSIS — R001 Bradycardia, unspecified: Secondary | ICD-10-CM | POA: Diagnosis present

## 2022-02-16 DIAGNOSIS — Z79899 Other long term (current) drug therapy: Secondary | ICD-10-CM | POA: Diagnosis not present

## 2022-02-16 DIAGNOSIS — R531 Weakness: Secondary | ICD-10-CM | POA: Diagnosis present

## 2022-02-16 DIAGNOSIS — R7989 Other specified abnormal findings of blood chemistry: Secondary | ICD-10-CM | POA: Diagnosis not present

## 2022-02-16 DIAGNOSIS — E785 Hyperlipidemia, unspecified: Secondary | ICD-10-CM

## 2022-02-16 DIAGNOSIS — U071 COVID-19: Secondary | ICD-10-CM | POA: Diagnosis present

## 2022-02-16 DIAGNOSIS — E119 Type 2 diabetes mellitus without complications: Secondary | ICD-10-CM

## 2022-02-16 DIAGNOSIS — R7402 Elevation of levels of lactic acid dehydrogenase (LDH): Secondary | ICD-10-CM | POA: Diagnosis present

## 2022-02-16 DIAGNOSIS — I441 Atrioventricular block, second degree: Secondary | ICD-10-CM

## 2022-02-16 DIAGNOSIS — I2489 Other forms of acute ischemic heart disease: Secondary | ICD-10-CM | POA: Diagnosis present

## 2022-02-16 DIAGNOSIS — I5023 Acute on chronic systolic (congestive) heart failure: Secondary | ICD-10-CM | POA: Diagnosis not present

## 2022-02-16 DIAGNOSIS — Z7982 Long term (current) use of aspirin: Secondary | ICD-10-CM | POA: Diagnosis not present

## 2022-02-16 DIAGNOSIS — Z807 Family history of other malignant neoplasms of lymphoid, hematopoietic and related tissues: Secondary | ICD-10-CM | POA: Diagnosis not present

## 2022-02-16 DIAGNOSIS — Z9181 History of falling: Secondary | ICD-10-CM | POA: Diagnosis not present

## 2022-02-16 DIAGNOSIS — E876 Hypokalemia: Secondary | ICD-10-CM | POA: Diagnosis present

## 2022-02-16 DIAGNOSIS — I5022 Chronic systolic (congestive) heart failure: Secondary | ICD-10-CM

## 2022-02-16 DIAGNOSIS — Z28310 Unvaccinated for covid-19: Secondary | ICD-10-CM | POA: Diagnosis not present

## 2022-02-16 DIAGNOSIS — R748 Abnormal levels of other serum enzymes: Secondary | ICD-10-CM | POA: Diagnosis present

## 2022-02-16 DIAGNOSIS — I2510 Atherosclerotic heart disease of native coronary artery without angina pectoris: Secondary | ICD-10-CM | POA: Diagnosis present

## 2022-02-16 DIAGNOSIS — I110 Hypertensive heart disease with heart failure: Secondary | ICD-10-CM | POA: Diagnosis present

## 2022-02-16 LAB — C-REACTIVE PROTEIN: CRP: 2.1 mg/dL — ABNORMAL HIGH (ref <1.0)

## 2022-02-16 LAB — CBG MONITORING, ED: Glucose-Capillary: 131 mg/dL — ABNORMAL HIGH (ref 70–99)

## 2022-02-16 LAB — FERRITIN: Ferritin: 247 ng/mL (ref 24–336)

## 2022-02-16 LAB — TROPONIN I (HIGH SENSITIVITY)
Troponin I (High Sensitivity): 30 ng/L — ABNORMAL HIGH (ref <18)
Troponin I (High Sensitivity): 27 ng/L — ABNORMAL HIGH (ref <18)
Troponin I (High Sensitivity): 35 ng/L — ABNORMAL HIGH (ref <18)

## 2022-02-16 LAB — PROCALCITONIN: Procalcitonin: <0.1 ng/mL

## 2022-02-16 LAB — BRAIN NATRIURETIC PEPTIDE: B Natriuretic Peptide: 2214 pg/mL — ABNORMAL HIGH (ref 0.0–100.0)

## 2022-02-16 LAB — CK: Total CK: 735 U/L — ABNORMAL HIGH (ref 49–397)

## 2022-02-16 LAB — D-DIMER, QUANTITATIVE: D-Dimer, Quant: 2.23 ug/mL-FEU — ABNORMAL HIGH (ref 0.00–0.50)

## 2022-02-16 LAB — MAGNESIUM: Magnesium: 2 mg/dL (ref 1.7–2.4)

## 2022-02-16 LAB — LACTATE DEHYDROGENASE: LDH: 282 U/L — ABNORMAL HIGH (ref 98–192)

## 2022-02-16 MED ORDER — CARVEDILOL 6.25 MG PO TABS: 12.5000 mg | ORAL_TABLET | Freq: Two times a day (BID) | ORAL | Status: DC

## 2022-02-16 MED ORDER — ENOXAPARIN SODIUM 40 MG/0.4ML IJ SOSY
40.0000 mg | PREFILLED_SYRINGE | INTRAMUSCULAR | Status: DC
  Administered 2022-02-16 – 2022-02-18 (×3): 40 mg via SUBCUTANEOUS
  Filled 2022-02-16 (×3): qty 0.4

## 2022-02-16 MED ORDER — ATROPINE SULFATE 1 MG/10ML IJ SOSY
PREFILLED_SYRINGE | INTRAMUSCULAR | Status: AC
  Administered 2022-02-16: 0.5 mg via INTRAVENOUS
  Filled 2022-02-16: qty 10

## 2022-02-16 MED ORDER — VITAMIN C 500 MG PO TABS
500.0000 mg | ORAL_TABLET | Freq: Every day | ORAL | Status: DC
  Administered 2022-02-16 – 2022-02-18 (×3): 500 mg via ORAL
  Filled 2022-02-16 (×3): qty 1

## 2022-02-16 MED ORDER — ATROPINE SULFATE 1 MG/ML IV SOLN: 0.5000 mg | Freq: Once | INTRAVENOUS | Status: DC

## 2022-02-16 MED ORDER — FUROSEMIDE 40 MG PO TABS
40.0000 mg | ORAL_TABLET | Freq: Every day | ORAL | Status: DC
  Administered 2022-02-16 – 2022-02-17 (×2): 40 mg via ORAL
  Filled 2022-02-16 (×2): qty 1

## 2022-02-16 MED ORDER — TRAZODONE HCL 50 MG PO TABS: 25.0000 mg | ORAL_TABLET | Freq: Every evening | ORAL | Status: DC | PRN

## 2022-02-16 MED ORDER — NIRMATRELVIR/RITONAVIR (PAXLOVID)TABLET
3.0000 | ORAL_TABLET | Freq: Two times a day (BID) | ORAL | Status: DC
  Administered 2022-02-16 – 2022-02-18 (×6): 3 via ORAL
  Filled 2022-02-16: qty 30

## 2022-02-16 MED ORDER — METHYLPREDNISOLONE SODIUM SUCC 125 MG IJ SOLR
1.0000 mg/kg | Freq: Two times a day (BID) | INTRAMUSCULAR | Status: DC
  Administered 2022-02-16: 86.25 mg via INTRAVENOUS
  Filled 2022-02-16: qty 2

## 2022-02-16 MED ORDER — SODIUM CHLORIDE 0.9 % IV SOLN: INTRAVENOUS | Status: DC

## 2022-02-16 MED ORDER — MAGNESIUM HYDROXIDE 400 MG/5ML PO SUSP: 30.0000 mL | Freq: Every day | ORAL | Status: DC | PRN

## 2022-02-16 MED ORDER — POTASSIUM CHLORIDE 20 MEQ PO PACK
40.0000 meq | PACK | Freq: Once | ORAL | Status: AC
  Administered 2022-02-16: 40 meq via ORAL
  Filled 2022-02-16: qty 2

## 2022-02-16 MED ORDER — GUAIFENESIN ER 600 MG PO TB12
600.0000 mg | ORAL_TABLET | Freq: Two times a day (BID) | ORAL | Status: DC
  Administered 2022-02-16 – 2022-02-18 (×6): 600 mg via ORAL
  Filled 2022-02-16 (×6): qty 1

## 2022-02-16 MED ORDER — FAMOTIDINE 20 MG PO TABS
20.0000 mg | ORAL_TABLET | Freq: Two times a day (BID) | ORAL | Status: DC
  Administered 2022-02-16 – 2022-02-18 (×5): 20 mg via ORAL
  Filled 2022-02-16 (×5): qty 1

## 2022-02-16 MED ORDER — SACUBITRIL-VALSARTAN 24-26 MG PO TABS
1.0000 | ORAL_TABLET | Freq: Two times a day (BID) | ORAL | Status: DC
  Administered 2022-02-16 – 2022-02-18 (×5): 1 via ORAL
  Filled 2022-02-16 (×5): qty 1

## 2022-02-16 MED ORDER — TRAZODONE HCL 50 MG PO TABS
50.0000 mg | ORAL_TABLET | Freq: Every day | ORAL | Status: DC
  Administered 2022-02-16 – 2022-02-17 (×3): 50 mg via ORAL
  Filled 2022-02-16 (×3): qty 1

## 2022-02-16 MED ORDER — ACETAMINOPHEN 325 MG PO TABS: 650.0000 mg | ORAL_TABLET | Freq: Four times a day (QID) | ORAL | Status: DC | PRN

## 2022-02-16 MED ORDER — ONDANSETRON HCL 4 MG/2ML IJ SOLN: 4.0000 mg | Freq: Four times a day (QID) | INTRAMUSCULAR | Status: DC | PRN

## 2022-02-16 MED ORDER — IOHEXOL 350 MG/ML SOLN
75.0000 mL | Freq: Once | INTRAVENOUS | Status: AC | PRN
  Administered 2022-02-16: 75 mL via INTRAVENOUS

## 2022-02-16 MED ORDER — ASPIRIN 81 MG PO CHEW
81.0000 mg | CHEWABLE_TABLET | Freq: Every day | ORAL | Status: DC
  Administered 2022-02-16 – 2022-02-18 (×3): 81 mg via ORAL
  Filled 2022-02-16 (×3): qty 1

## 2022-02-16 MED ORDER — GUAIFENESIN-DM 100-10 MG/5ML PO SYRP: 10.0000 mL | ORAL_SOLUTION | ORAL | Status: DC | PRN

## 2022-02-16 MED ORDER — POTASSIUM CHLORIDE CRYS ER 20 MEQ PO TBCR
40.0000 meq | EXTENDED_RELEASE_TABLET | Freq: Once | ORAL | Status: AC
  Administered 2022-02-16: 40 meq via ORAL
  Filled 2022-02-16: qty 2

## 2022-02-16 MED ORDER — ATROPINE SULFATE 1 MG/10ML IJ SOSY: 0.5000 mg | PREFILLED_SYRINGE | Freq: Once | INTRAMUSCULAR | Status: AC

## 2022-02-16 MED ORDER — ZINC SULFATE 220 (50 ZN) MG PO CAPS
220.0000 mg | ORAL_CAPSULE | Freq: Every day | ORAL | Status: DC
  Administered 2022-02-16 – 2022-02-18 (×3): 220 mg via ORAL
  Filled 2022-02-16 (×3): qty 1

## 2022-02-16 MED ORDER — POTASSIUM CHLORIDE CRYS ER 20 MEQ PO TBCR
20.0000 meq | EXTENDED_RELEASE_TABLET | Freq: Every day | ORAL | Status: DC
  Administered 2022-02-16 – 2022-02-18 (×3): 20 meq via ORAL
  Filled 2022-02-16 (×3): qty 1

## 2022-02-16 MED ORDER — PREDNISONE 20 MG PO TABS: 50.0000 mg | ORAL_TABLET | Freq: Every day | ORAL | Status: DC

## 2022-02-16 MED ORDER — HYDROCOD POLI-CHLORPHE POLI ER 10-8 MG/5ML PO SUER: 5.0000 mL | Freq: Two times a day (BID) | ORAL | Status: DC | PRN

## 2022-02-16 MED ORDER — GLIPIZIDE 5 MG PO TABS
5.0000 mg | ORAL_TABLET | Freq: Every day | ORAL | Status: DC
  Administered 2022-02-16 – 2022-02-18 (×3): 5 mg via ORAL
  Filled 2022-02-16 (×5): qty 1

## 2022-02-16 MED ORDER — ONDANSETRON HCL 4 MG PO TABS: 4.0000 mg | ORAL_TABLET | Freq: Four times a day (QID) | ORAL | Status: DC | PRN

## 2022-02-16 NOTE — Assessment & Plan Note (Addendum)
Replaced. °

## 2022-02-16 NOTE — ED Notes (Signed)
Pt assisted back into bed. Pt placed back on card monitor.

## 2022-02-16 NOTE — Assessment & Plan Note (Addendum)
Cardiology consultation to evaluate for need for pacemaker.  Hold beta-blocker.  Check orthostatic vital signs.

## 2022-02-16 NOTE — ED Provider Notes (Signed)
[facility] Provider Note    Event Date/Time   First MD Initiated Contact with Patient 02/16/22 [phone]     (approximate)   History   Weakness   HPI  Cole Collins is a 71 y.o. male history of hypertension, diabetes, CHF, CAD who presents to the emergency department with complaints of generalized weakness.  States symptoms ongoing for several days.  Initially had a nonproductive cough that has resolved.  Denies any chest pain, shortness of breath, fevers, vomiting or diarrhea.  States that his legs gave out from underneath him on Saturday and he fell to the ground and injured his left ankle and felt a "pop".  States he fell onto his bottom and did not hit his head.  No neck or back pain.  States he lives at home alone and normally ambulates without any assistive devices.   History provided by patient and son.    Past Medical History:  Diagnosis Date   CHF (congestive heart failure) ([place])    Diabetes ([place]) 2019   Hypertension 2019    Past Surgical History:  Procedure Laterality Date   CATARACT EXTRACTION W/PHACO Right 03/04/2021   Procedure: CATARACT EXTRACTION PHACO AND INTRAOCULAR LENS PLACEMENT ([place]) RIGHT RAYNOR LENS 19.58 01:31.7;  Surgeon: Birder Robson, MD;  Location: [place];  Service: Ophthalmology;  Laterality: Right;   CATARACT EXTRACTION W/PHACO Left 03/18/2021   Procedure: CATARACT EXTRACTION PHACO AND INTRAOCULAR LENS PLACEMENT ([place]) LEFT RAYNER LENS;  Surgeon: Birder Robson, MD;  Location: [place];  Service: Ophthalmology;  Laterality: Left;  15.74 1:21.8   LEFT HEART CATH AND CORONARY ANGIOGRAPHY Bilateral 07/23/2020   Procedure: LEFT HEART CATH AND CORONARY ANGIOGRAPHY;  Surgeon: Isaias Cowman, MD;  Location: [place] [facility];  Service: Cardiovascular;  Laterality: Bilateral;    MEDICATIONS:  Prior to Admission medications   Medication Sig Start Date End Date Taking? Authorizing Provider   carvedilol (COREG) 12.5 MG tablet Take 12.5 mg by mouth 2 (two) times daily with a meal.    [provider]  ENTRESTO 24-26 MG Take 1 tablet by mouth 2 (two) times daily. 06/27/20   [provider]  furosemide (LASIX) 20 MG tablet Take 1 tablet (20 mg total) by mouth daily. Patient taking differently: Take 40 mg by mouth daily. 04/11/20   Wouk, Ailene Rud, MD  glipiZIDE (GLUCOTROL) 5 MG tablet Take 5 mg by mouth daily before breakfast. 03/31/20   [provider]  potassium chloride SA (KLOR-CON) 20 MEQ tablet Take 1 tablet (20 mEq total) by mouth daily. 04/11/20   Wouk, Ailene Rud, MD  traZODone (DESYREL) 50 MG tablet Take 50 mg by mouth at bedtime. 06/14/20   [provider]    Physical Exam   Triage Vital Signs: ED Triage Vitals  Enc Vitals Group     BP 02/15/22 2156 (!) 172/70     Pulse Rate 02/15/22 2156 (!) 54     Resp 02/15/22 2156 18     Temp 02/15/22 2156 98.4 F (36.9 C)     Temp Source 02/15/22 2156 Oral     SpO2 02/15/22 2156 94 %     Weight 02/15/22 2151 190 lb (86.2 kg)     Height 02/15/22 2151 5\' 10"  (1.778 m)     Head Circumference --      Peak Flow --      Pain Score 02/15/22 2151 0     Pain Loc --      Pain  Edu? --      Excl. in [place]? --     Most recent vital signs: Vitals:   02/15/22 2156 02/16/22 0111  BP: (!) 172/70 (!) 169/72  Pulse: (!) 54 69  Resp: 18 18  Temp: 98.4 F (36.9 C) 99.2 F (37.3 C)  SpO2: 94% 97%    CONSTITUTIONAL: Alert and oriented and responds appropriately to questions. Well-appearing; well-nourished HEAD: Normocephalic, atraumatic EYES: Conjunctivae clear, pupils appear equal, sclera nonicteric ENT: normal nose; moist mucous membranes NECK: Supple, normal ROM CARD: RRR; S1 and S2 appreciated; no murmurs, no clicks, no rubs, no gallops RESP: Normal chest excursion without splinting or tachypnea; breath sounds clear and equal bilaterally; no wheezes, no rhonchi, no rales, no hypoxia or  respiratory distress, speaking full sentences ABD/GI: Normal bowel sounds; non-distended; soft, non-tender, no rebound, no guarding, no peritoneal signs BACK: The back appears normal EXT: Patient has some soft tissue swelling and tenderness to the left ankle.  Little bit of tenderness over the dorsal left foot as well.  Extremities warm well perfused.  Compartments soft.  No calf tenderness or calf swelling. SKIN: Normal color for age and race; warm; no rash on exposed skin NEURO: Moves all extremities equally, normal speech, no facial asymmetry, requires 2 pacing assist to even stand and pivot from wheelchair into the bed, normal sensation PSYCH: The patient's mood and manner are appropriate.   ED Results / Procedures / Treatments   LABS: (all labs ordered are listed, but only abnormal results are displayed) Labs Reviewed  RESP PANEL BY RT-PCR (FLU A&B, COVID) ARPGX2 - Abnormal; Notable for the following components:      Result Value   SARS Coronavirus 2 by RT PCR POSITIVE (*)    All other components within normal limits  CBC WITH DIFFERENTIAL/PLATELET - Abnormal; Notable for the following components:   Monocytes Absolute 1.3 (*)    All other components within normal limits  COMPREHENSIVE METABOLIC PANEL - Abnormal; Notable for the following components:   Potassium 3.3 (*)    Glucose, Bld 107 (*)    Total Protein 8.2 (*)    AST 47 (*)    Total Bilirubin 1.4 (*)    All other components within normal limits  URINALYSIS, ROUTINE W REFLEX MICROSCOPIC - Abnormal; Notable for the following components:   Color, Urine AMBER (*)    APPearance HAZY (*)    Hgb urine dipstick MODERATE (*)    Ketones, ur 20 (*)    Protein, ur >=300 (*)    Bacteria, UA RARE (*)    All other components within normal limits  BRAIN NATRIURETIC PEPTIDE - Abnormal; Notable for the following components:   B Natriuretic Peptide 2,610.1 (*)    All other components within normal limits  CK - Abnormal; Notable for  the following components:   Total CK 735 (*)    All other components within normal limits  D-DIMER, QUANTITATIVE - Abnormal; Notable for the following components:   D-Dimer, Quant 2.23 (*)    All other components within normal limits  TROPONIN I (HIGH SENSITIVITY) - Abnormal; Notable for the following components:   Troponin I (High Sensitivity) 33 (*)    All other components within normal limits  TROPONIN I (HIGH SENSITIVITY) - Abnormal; Notable for the following components:   Troponin I (High Sensitivity) 27 (*)    All other components within normal limits  MAGNESIUM  FERRITIN  LACTATE DEHYDROGENASE  C-REACTIVE PROTEIN  BRAIN NATRIURETIC PEPTIDE  PROCALCITONIN  TROPONIN I (  HIGH SENSITIVITY)     EKG:  EKG Interpretation  Date/Time:  Sunday February 15 2022 22:21:18 EDT Ventricular Rate:  53 PR Interval:    QRS Duration: 84 QT Interval:  420 QTC Calculation: 394 R Axis:   -44 Text Interpretation:  Critical Test Result: AV Block Sinus rhythm with 2nd degree A-V block (Mobitz I) Left axis deviation Nonspecific ST and T wave abnormality Abnormal ECG When compared with ECG of 08-Apr-2020 19:16, Sinus rhythm has replaced Junctional rhythm Vent. rate has decreased BY  64 BPM Nonspecific T wave abnormality no longer evident in Inferior leads T wave inversion no longer evident in Lateral leads Confirmed by Addilyne Backs, Cyril Mourning [phone]) on 02/16/2022 1:00:36 AM            RADIOLOGY: My personal review and interpretation of imaging: Chest x-ray clear. X-ray of the foot and ankle show no fracture or dislocation.   I have personally reviewed all radiology reports.   DG Foot Complete Left  Result Date: 02/16/2022 CLINICAL DATA:  Fall EXAM: LEFT FOOT - COMPLETE 3+ VIEW COMPARISON:  None Available. FINDINGS: No acute bony abnormality. Specifically, no fracture, subluxation, or dislocation. 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 Soft tissues are intact. IMPRESSION: No acute bony  abnormality. Electronically Signed   By: Rolm Baptise M.D.   On: 02/16/2022 01:58   DG Ankle Complete Left  Result Date: 02/16/2022 CLINICAL DATA:  Fall EXAM: LEFT ANKLE COMPLETE - 3+ VIEW COMPARISON:  None Available. FINDINGS: Diffuse soft tissue swelling. Well corticated bone fragment adjacent to the lateral malleolus tip, likely related to old injury. No acute fracture, subluxation or dislocation. Joint spaces maintained. IMPRESSION: No acute bony abnormality. Electronically Signed   By: Rolm Baptise M.D.   On: 02/16/2022 01:57   DG Chest Portable 1 View  Result Date: 02/15/2022 CLINICAL DATA:  Weakness EXAM: PORTABLE CHEST 1 VIEW COMPARISON:  None Available. FINDINGS: The heart size and mediastinal contours are within normal limits. Both lungs are clear. The visualized skeletal structures are unremarkable. IMPRESSION: No active disease. Electronically Signed   By: Ronney Asters M.D.   On: 02/15/2022 22:26     PROCEDURES:  Critical Care performed: Yes, see critical care procedure note(s)   CRITICAL CARE Performed by: Cyril Mourning Nilton Lave   Total critical care time: 45 minutes  Critical care time was exclusive of separately billable procedures and treating other patients.  Critical care was necessary to treat or prevent imminent or life-threatening deterioration.  Critical care was time spent personally by me on the following activities: development of treatment plan with patient and/or surrogate as well as nursing, discussions with consultants, evaluation of patient's response to treatment, examination of patient, obtaining history from patient or surrogate, ordering and performing treatments and interventions, ordering and review of laboratory studies, ordering and review of radiographic studies, pulse oximetry and re-evaluation of patient's condition.   Marland Kitchen1-3 Lead EKG Interpretation  Performed by: Mersades Barbaro, Delice Bison, DO Authorized by: Hayze Gazda, Delice Bison, DO     Interpretation: normal      ECG rate:  69   ECG rate assessment: normal     Rhythm: sinus rhythm     Ectopy: none     Conduction: normal       IMPRESSION / MDM / ASSESSMENT AND PLAN / ED COURSE  I reviewed the triage vital signs and the nursing notes.    Patient here with generalized weakness, fall.  The patient is on the cardiac monitor to evaluate for evidence of arrhythmia  and/or significant heart rate changes.   DIFFERENTIAL DIAGNOSIS (includes but not limited to):   Anemia, electrolyte derangement, dehydration, UTI, COVID, ACS, arrhythmia, less likely CVA given no focal neurologic deficits on exam   Patient's presentation is most consistent with acute presentation with potential threat to life or bodily function.   PLAN: We will obtain CBC, CMP, troponin, urinalysis, COVID swab, x-ray of the left foot and ankle.  EKG shows second-degree Mobitz 1 heart block.  Will place on cardiac monitoring.  This seems to be new for him.  He denies any chest pain, shortness of breath, syncope.   MEDICATIONS GIVEN IN ED: Medications  potassium chloride SA (KLOR-CON M) CR tablet 40 mEq (has no administration in time range)  carvedilol (COREG) tablet 12.5 mg (has no administration in time range)  sacubitril-valsartan (ENTRESTO) 24-26 mg per tablet (has no administration in time range)  furosemide (LASIX) tablet 40 mg (has no administration in time range)  traZODone (DESYREL) tablet 50 mg (has no administration in time range)  glipiZIDE (GLUCOTROL) tablet 5 mg (has no administration in time range)  potassium chloride SA (KLOR-CON M) CR tablet 20 mEq (has no administration in time range)  enoxaparin (LOVENOX) injection 40 mg (has no administration in time range)  0.9 %  sodium chloride infusion (has no administration in time range)  nirmatrelvir/ritonavir EUA (PAXLOVID) 3 tablet (has no administration in time range)  guaiFENesin-dextromethorphan (ROBITUSSIN DM) 100-10 MG/5ML syrup 10 mL (has no administration in time  range)  chlorpheniramine-HYDROcodone (TUSSIONEX) 10-8 MG/5ML suspension 5 mL (has no administration in time range)  methylPREDNISolone sodium succinate (SOLU-MEDROL) 125 mg/2 mL injection 86.25 mg (has no administration in time range)    Followed by  predniSONE (DELTASONE) tablet 50 mg (has no administration in time range)  ascorbic acid (VITAMIN C) tablet 500 mg (has no administration in time range)  zinc sulfate capsule 220 mg (has no administration in time range)  famotidine (PEPCID) tablet 20 mg (has no administration in time range)  acetaminophen (TYLENOL) tablet 650 mg (has no administration in time range)  magnesium hydroxide (MILK OF MAGNESIA) suspension 30 mL (has no administration in time range)  ondansetron (ZOFRAN) tablet 4 mg (has no administration in time range)    Or  ondansetron (ZOFRAN) injection 4 mg (has no administration in time range)  guaiFENesin (MUCINEX) 12 hr tablet 600 mg (has no administration in time range)     ED COURSE: Patient's labs show normal hemoglobin, potassium of 3.3.  Will give oral replacement.  Troponin slightly elevated but downtrending.  CK mildly elevated at 735 but does have a BNP of 2600 and is COVID-positive so we will be very cautious with fluids.  Urine shows no sign of infection today.  We will check magnesium level given his new Mobitz 1 block.  Given he is extremely weak and cannot even stand and ambulate on his own, will discuss with hospitalist for admission.   X-rays of the chest, left foot and ankle reviewed and interpreted by myself and the radiologist and showed no acute abnormality.  CONSULTS:  Consulted and discussed patient's case with hospitalist, Dr. Arville CareMansy.  I have recommended admission and consulting physician agrees and will place admission orders.  Patient (and family if present) agree with this plan.   I reviewed all nursing notes, vitals, pertinent previous records.  All labs, EKGs, imaging ordered have been independently  reviewed and interpreted by myself.    OUTSIDE RECORDS REVIEWED: Reviewed patient's last cardiology note on 07/03/2021.  FINAL CLINICAL IMPRESSION(S) / ED DIAGNOSES   Final diagnoses:  COVID-19  Generalized weakness  Second degree heart block     Rx / DC Orders   ED Discharge Orders     None        Note:  This document was prepared using Dragon voice recognition software and may include unintentional dictation errors.   Aunesty Tyson, Delice Bison, DO 02/16/22 [phone]

## 2022-02-16 NOTE — ED Notes (Signed)
Pt HR noted to drop to 35-39 BPM, with episodes lasting several seconds. Pt denies any dizziness, chest pain, or shortness of breath during bradycardic episodes. Pt moved to ED room 14 with report given and care endorsed to D. Jimmye Norman, RN at this time. Repeat EKG obtained. Dr Sidney Ace notified.

## 2022-02-16 NOTE — ED Notes (Signed)
Patient offered a hospital bed for comfort.  Report he is "fine" right now.  Provided with personal hygiene items.  No complaints voiced at this time

## 2022-02-16 NOTE — Assessment & Plan Note (Signed)
Patient on glipizide.  Also placed on sliding scale and add on hemoglobin A1c.

## 2022-02-16 NOTE — Assessment & Plan Note (Addendum)
CT scan of the chest negative for pulmonary embolism.  D-dimer trended down likely elevated with COVID infection.

## 2022-02-16 NOTE — Progress Notes (Signed)
  Progress Note   Patient: Cole Collins CEY:[id number] DOB: [birth year] DOA: 02/16/2022     0 DOS: the patient was seen and examined on 02/16/2022     Assessment and Plan: * Mobitz type 2 second degree heart block Cardiology consultation to evaluate for need for pacemaker.  Hold beta-blocker.  Check orthostatic vital signs.  COVID-19 virus infection Paxlovid started  Hypokalemia Potassium supplementation  Elevated d-dimer CT scan of the chest negative for pulmonary embolism  Chronic systolic CHF (congestive heart failure) (HCC) Continue Entresto and Lasix.  Holding Coreg with type II heart block.  Dyslipidemia Hold Crestor with elevated CPK.  Controlled type 2 diabetes mellitus without complication, without long-term current use of insulin (HCC) -We will continue glipizide and place him on supplemental coverage with NovoLog.        Subjective: Patient states he is feeling very weak and his legs gave out.  Not feeling well at all.  Patient states that he has heart trouble.  In the ER, found to be COVID-positive.  Also found to be bradycardic overnight.  Repeat EKG showing second-degree heart block type II.    Physical Exam: Vitals:   02/16/22 0900 02/16/22 0930 02/16/22 1000 02/16/22 1030  BP: 118/69 116/66 (!) 113/58 118/74  Pulse:      Resp: (!) 27 (!) 22 (!) 22 (!) 23  Temp:      TempSrc:      SpO2: (!) 89% (!) 88% 90% 91%  Weight:      Height:       Physical Exam HENT:     Head: Normocephalic.  Eyes:     General: Lids are normal.     Conjunctiva/sclera: Conjunctivae normal.  Cardiovascular:     Rate and Rhythm: Regular rhythm. Bradycardia present.     Heart sounds: Normal heart sounds, S1 normal and S2 normal.  Pulmonary:     Breath sounds: Examination of the right-lower field reveals decreased breath sounds. Examination of the left-lower field reveals decreased breath sounds. Decreased breath sounds present. No wheezing, rhonchi or rales.  Abdominal:      Palpations: Abdomen is soft.     Tenderness: There is no abdominal tenderness.  Musculoskeletal:     Right lower leg: No swelling.     Left lower leg: No swelling.  Skin:    General: Skin is warm.     Findings: No rash.  Neurological:     Mental Status: He is alert and oriented to person, place, and time.     Data Reviewed: CT scan no pulmonary embolism, triple-vessel disease, potassium 3.3, BNP 2014, creatinine 1.4 EKG shows second-degree AV block type II  Family Communication: Spoke with son at the bedside  Disposition: Status is: Inpatient Remains inpatient appropriate because: Cardiology consultation to evaluate for potential pacemaker.  Planned Discharge Destination: Home    Time spent: 28 minutes  Author: Loletha Grayer, MD 02/16/2022 12:51 PM  For on call review [email or web address].

## 2022-02-16 NOTE — Assessment & Plan Note (Addendum)
Continue Entresto and Lasix.  Holding Coreg with type II heart block.

## 2022-02-16 NOTE — ED Notes (Signed)
Breakfast tray given to pt 

## 2022-02-16 NOTE — ED Notes (Signed)
Patient assisted to bedside commode.

## 2022-02-16 NOTE — Assessment & Plan Note (Addendum)
Paxlovid started

## 2022-02-16 NOTE — H&P (Signed)
[facility]   PATIENT NAME: Cole Collins    MR#:  [id number]  DATE OF BIRTH:  1950/05/10  DATE OF ADMISSION:  02/16/2022  PRIMARY CARE PHYSICIAN: Gracelyn Nurse, MD   Patient is coming from: Home  REQUESTING/REFERRING PHYSICIAN: Ward, Layla Maw, DO   CHIEF COMPLAINT:   Chief Complaint  Patient presents with   Weakness    HISTORY OF PRESENT ILLNESS:  Cole Collins is a 71 y.o. Caucasian male with medical history significant for systolic CHF with EF of 30 to 17%, type II diabetes mellitus and hypertension, who presented to the emergency room with onset of generalized weakness for the last few days with associated cough productive of clear sputum.  His legs gave out from underneath him on Saturday and he fell to the ground without head injuries or loss of consciousness.  He stated that he injured his left ankle as he felt a "pop".  No neck pain or stiffness or paresthesias or focal muscle weakness.  Lives alone and usually ambulates without any help with devices.  No nausea or vomiting or diarrhea or abdominal pain.  No dysuria, oliguria or hematuria or flank pain.  He admits to dyspnea without wheezing.  No chest pain or palpitations.  No bleeding diathesis.  ED Course: When he came to the ER, BP was 172/70 with heart rate of 54 with otherwise normal vital signs.  Labs revealed potassium of 3.3 AST 47 ALT T of 26 with total protein of 8.2 total bili 1.4 and otherwise unremarkable CMP.  His BNP was significantly elevated at 2610 with total CK of 735 and LDH 282 with troponin I of 30 and later 27 and prior to that it was 33.  Ferritin was 247.  CBC was within normal.  D-dimer was 2.23.  Magnesium was 2 EKG as reviewed by me : EKG showed sinus rhythm with a rate of 83 with second-degree AV block Mobitz 1 with primary progression Imaging: For which x-ray showed no acute cardiopulmonary disease left ankle x-ray showed no acute bony abnormality and left foot x-ray was  negative.  The patient was given form: Potassium chloride and will be started on p.o. Paxlovid.  He will be admitted to a medical telemetry bed for further evaluation and management. PAST MEDICAL HISTORY:   Past Medical History:  Diagnosis Date   CHF (congestive heart failure) (HCC)    Diabetes (HCC) 2019   Hypertension 2019    PAST SURGICAL HISTORY:   Past Surgical History:  Procedure Laterality Date   CATARACT EXTRACTION W/PHACO Right 03/04/2021   Procedure: CATARACT EXTRACTION PHACO AND INTRAOCULAR LENS PLACEMENT (IOC) RIGHT RAYNOR LENS 19.58 01:31.7;  Surgeon: Galen Manila, MD;  Location: [facility];  Service: Ophthalmology;  Laterality: Right;   CATARACT EXTRACTION W/PHACO Left 03/18/2021   Procedure: CATARACT EXTRACTION PHACO AND INTRAOCULAR LENS PLACEMENT (IOC) LEFT RAYNER LENS;  Surgeon: Galen Manila, MD;  Location: [facility];  Service: Ophthalmology;  Laterality: Left;  15.74 1:21.8   LEFT HEART CATH AND CORONARY ANGIOGRAPHY Bilateral 07/23/2020   Procedure: LEFT HEART CATH AND CORONARY ANGIOGRAPHY;  Surgeon: Marcina Millard, MD;  Location: [facility];  Service: Cardiovascular;  Laterality: Bilateral;    SOCIAL HISTORY:   Social History   Tobacco Use   Smoking status: Former    Packs/day: 0.50    Years: 50.00    Total pack years: 25.00    Types: Cigarettes    Quit date: 04/06/2020  Years since quitting: 1.8   Smokeless tobacco: Never  Substance Use Topics   Alcohol use: Not Currently    FAMILY HISTORY:   Family History  Problem Relation Age of Onset   Dementia Mother    Lymphoma Father     DRUG ALLERGIES:  No Known Allergies  REVIEW OF SYSTEMS:   ROS As per history of present illness. All pertinent systems were reviewed above. Constitutional, HEENT, cardiovascular, respiratory, GI, GU, musculoskeletal, neuro, psychiatric, endocrine, integumentary and hematologic systems were reviewed and are otherwise  negative/unremarkable except for positive findings mentioned above in the HPI.   MEDICATIONS AT HOME:   Prior to Admission medications   Medication Sig Start Date End Date Taking? Authorizing Provider  carvedilol (COREG) 12.5 MG tablet Take 12.5 mg by mouth 2 (two) times daily with a meal.    [provider]  ENTRESTO 24-26 MG Take 1 tablet by mouth 2 (two) times daily. 06/27/20   [provider]  furosemide (LASIX) 20 MG tablet Take 1 tablet (20 mg total) by mouth daily. Patient taking differently: Take 40 mg by mouth daily. 04/11/20   Wouk, Wilfred Curtis, MD  glipiZIDE (GLUCOTROL) 5 MG tablet Take 5 mg by mouth daily before breakfast. 03/31/20   [provider]  potassium chloride SA (KLOR-CON) 20 MEQ tablet Take 1 tablet (20 mEq total) by mouth daily. 04/11/20   Wouk, Wilfred Curtis, MD  traZODone (DESYREL) 50 MG tablet Take 50 mg by mouth at bedtime. 06/14/20   [provider]      VITAL SIGNS:  Blood pressure (!) 169/72, pulse 69, temperature 99.2 F (37.3 C), temperature source Oral, resp. rate 18, height 5\' 10"  (1.778 m), weight 86.2 kg, SpO2 97 %.  PHYSICAL EXAMINATION:  Physical Exam  GENERAL:  71 y.o.-year-old Caucasian male patient lying in the bed with no acute distress.  EYES: Pupils equal, round, reactive to light and accommodation. No scleral icterus. Extraocular muscles intact.  HEENT: Head atraumatic, normocephalic. Oropharynx and nasopharynx clear.  NECK:  Supple, no jugular venous distention. No thyroid enlargement, no tenderness.  LUNGS: Normal breath sounds bilaterally, no wheezing, rales,rhonchi or crepitation. No use of accessory muscles of respiration.  CARDIOVASCULAR: Regular rate and rhythm, S1, S2 normal. No murmurs, rubs, or gallops.  ABDOMEN: Soft, nondistended, nontender. Bowel sounds present. No organomegaly or mass.  EXTREMITIES: No pedal edema, cyanosis, or clubbing.  NEUROLOGIC: Cranial nerves II through XII are intact.  Muscle strength 5/5 in all extremities. Sensation intact. Gait not checked.  PSYCHIATRIC: The patient is alert and oriented x 3.  Normal affect and good eye contact. SKIN: No obvious rash, lesion, or ulcer.   LABORATORY PANEL:   CBC Recent Labs  Lab 02/15/22 2153  WBC 6.9  HGB 16.3  HCT 49.3  PLT 167   ------------------------------------------------------------------------------------------------------------------  Chemistries  Recent Labs  Lab 02/15/22 2153 02/15/22 2157  NA 139  --   K 3.3*  --   CL 103  --   CO2 25  --   GLUCOSE 107*  --   BUN 18  --   CREATININE 1.14  --   CALCIUM 9.2  --   MG  --  2.0  AST 47*  --   ALT 26  --   ALKPHOS 60  --   BILITOT 1.4*  --    ------------------------------------------------------------------------------------------------------------------  Cardiac Enzymes No results for input(s): "TROPONINI" in the last 168 hours. ------------------------------------------------------------------------------------------------------------------  RADIOLOGY:  DG Foot Complete Left  Result Date: 02/16/2022 CLINICAL  DATA:  Fall EXAM: LEFT FOOT - COMPLETE 3+ VIEW COMPARISON:  None Available. FINDINGS: No acute bony abnormality. Specifically, no fracture, subluxation, or dislocation. 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 Soft tissues are intact. IMPRESSION: No acute bony abnormality. Electronically Signed   By: Charlett Nose M.D.   On: 02/16/2022 01:58   DG Ankle Complete Left  Result Date: 02/16/2022 CLINICAL DATA:  Fall EXAM: LEFT ANKLE COMPLETE - 3+ VIEW COMPARISON:  None Available. FINDINGS: Diffuse soft tissue swelling. Well corticated bone fragment adjacent to the lateral malleolus tip, likely related to old injury. No acute fracture, subluxation or dislocation. Joint spaces maintained. IMPRESSION: No acute bony abnormality. Electronically Signed   By: Charlett Nose M.D.   On: 02/16/2022 01:57   DG Chest Portable 1 View  Result  Date: 02/15/2022 CLINICAL DATA:  Weakness EXAM: PORTABLE CHEST 1 VIEW COMPARISON:  None Available. FINDINGS: The heart size and mediastinal contours are within normal limits. Both lungs are clear. The visualized skeletal structures are unremarkable. IMPRESSION: No active disease. Electronically Signed   By: Darliss Cheney M.D.   On: 02/15/2022 22:26      IMPRESSION AND PLAN:  Assessment and Plan: * COVID-19 virus infection - The patient will be admitted to a medical telemetry bed. - I believe that this is the culprit for his generalized weakness and falls. - We will place on p.o. Paxil with - We will follow inflammatory markers. - We will monitor his oxygenation. - Will be placed on vitamin C and zinc sulfate as well as mucolytic therapy.  Hypokalemia - Potassium will be replaced and magnesium was normal.  Elevated d-dimer - We will obtain chest CTA to rule out PE  Chronic systolic CHF (congestive heart failure) (HCC) - We will continue his Entresto and Lasix. - We will hold off Coreg given his bradycardia and Mobitz type I second-degree AV block.  Dyslipidemia - We will continue statin therapy.  Controlled type 2 diabetes mellitus without complication, without long-term current use of insulin (HCC) -We will continue glipizide and place him on supplemental coverage with NovoLog.       DVT prophylaxis: Lovenox.  Advanced Care Planning:  Code Status: full code.  Family Communication:  The plan of care was discussed in details with the patient (and family). I answered all questions. The patient agreed to proceed with the above mentioned plan. Further management will depend upon hospital course. Disposition Plan: Back to previous home environment Consults called: none.  All the records are reviewed and case discussed with ED provider.  Status is: Inpatient    At the time of the admission, it appears that the appropriate admission status for this patient is inpatient.  This  is judged to be reasonable and necessary in order to provide the required intensity of service to ensure the patient's safety given the presenting symptoms, physical exam findings and initial radiographic and laboratory data in the context of comorbid conditions.  The patient requires inpatient status due to high intensity of service, high risk of further deterioration and high frequency of surveillance required.  I certify that at the time of admission, it is my clinical judgment that the patient will require inpatient hospital care extending more than 2 midnights.                            Dispo: The patient is from: Home  Anticipated d/c is to: Home              Patient currently is not medically stable to d/c.              Difficult to place patient: No  Christel Mormon M.D on 02/16/2022 at 4:52 AM  [facility]   From 7 PM-7 AM, contact night-coverage [email or web address]  CC: Primary care physician; Baxter Hire, MD

## 2022-02-16 NOTE — ED Notes (Signed)
Assisted pt to toilet. Pt is 1-person assist.

## 2022-02-16 NOTE — ED Notes (Signed)
Pt complaining of left foot tenderness, foot does appear to be a little swollen.

## 2022-02-16 NOTE — Assessment & Plan Note (Addendum)
Hold Crestor with elevated CPK.

## 2022-02-16 NOTE — Consult Note (Signed)
[facility]       Patient ID: Cole Collins MRN: [id number] DOB/AGE: 73/10/1950 71 y.o.  Admit date: 02/16/2022 Referring Physician Dr. Renae GlossWieting Primary Physician Dr. Letitia LibraJohnston Primary Cardiologist Previous Dr. Beatrix Fettersrgel  Reason for Consultation new 2nd Degree Type 2 AV block   HPI: Cole Collins is a 71yoM with a [facility] for which referral to CT surgery for salvage CABG versus complex PCI was recommended, dilated cardiomyopathy (EF 35% 06/2021), type 2 diabetes, hypertension, hyperlipidemia, history of tobacco use who [facility]16/2022 with generalized weakness times several days saying his legs gave out on him, and a nonproductive cough that has since resolved.  He was found incidentally COVID-positive on presentation and has a new second-degree type II AV block on EKG for which cardiology is consulted for further assistance.  The patient has a complex cardiac history with most recent LHC revealed three-vessel CAD by left heart cath 07/2020.  [facility] consideration of salvage CABG versus complex PCI.  Ultimately the patient was seen again by his regular cardiologist at the time (Dr. Lady GaryFath) and ordered a cardiac MRI to assess the viability of his myocardium before further PCI was performed.  He had this done, which revealed viability of his LAD and RCA and nonviability of his left circumflex.  At his most recent cardiology visit 07/2021 the patient was hesitant to proceed with further LHC and potential complex PCI or defibrillator for primary prevention which was recommended.  Ultimately was follow-ups were delayed due to a dermoid cyst excision and cataract surgery.  [facility] with generalized weakness for several days, citing his legs just gave out from underneath him, he says he slid off his bed on Saturday evening and did not have the strength in his arms or legs to get up  off the ground.  He said he slept that night on the floor, and crawled around on the floor before being able to get up the following morning.  He remains generally weak, and had another instance of significant leg weakness, needing to slide to the ground due to his inability to remain standing.  He says that just 2 weeks ago he was at the beach with his family, feeling well.  He said he had a cough for several days but this has since resolved.  He has been feeling short of breath above his baseline, but denies chest discomfort, dizziness.  He thinks his peripheral edema is about at baseline.  He was COVID-positive incidentally on presentation, denies history of prior COVID vaccination.  He is insistent that his COVID infection has caused his significant weakness and most of his issues with his heart.  In the ED he was found to be in a second-degree AV block type II, his home carvedilol has been held since admission, heart rate on telemetry in the 40s, increases to the 50s with conversation during interview and what appears to be a second-degree AV block type I (Wenckebach).  He admits he eats a lot of junk food, hot dogs, hamburgers but does not add salt to his foods.  Says he "is not a good medicine taker" mostly in relation to his diabetes medicines, but admits to compliance with his Entresto, carvedilol, and Lasix.  Denies fever or chills.   Vitals are notable for a blood pressure of 155/85, heart rate in sinus rhythm with intermittent second-degree Mobitz  type II heart block on telemetry with rate in the 40s to 50s with conversation.  He is comfortable on room air.   Labs are notable for hypokalemia initially with potassium 3.3, normal magnesium 2.0 BUN/creatinine 18/1.14 and GFR greater than 60.  High-sensitivity troponin minimally elevated with a flat trend at 33-30-27-35.  BNP elevated at 2600, repeat downtrending this morning at 2200, elevated CK to 735, elevated LDH to 282.  No leukocytosis with WBCs  6.9, H&H stable at 16/49 and platelets 167.  D-dimer elevated to 2.23.  CTA chest negative for pulmonary embolus, trace right pleural effusion, mild cardiomegaly and aortic atherosclerosis with left main and 3 VD CAD.  Review of systems complete and found to be negative unless listed above     Past Medical History:  Diagnosis Date   CHF (congestive heart failure) (HCC)    Diabetes (HCC) 2019   Hypertension 2019    Past Surgical History:  Procedure Laterality Date   CATARACT EXTRACTION W/PHACO Right 03/04/2021   Procedure: CATARACT EXTRACTION PHACO AND INTRAOCULAR LENS PLACEMENT (IOC) RIGHT RAYNOR LENS 19.58 01:31.7;  Surgeon: Galen Manila, MD;  Location: [facility];  Service: Ophthalmology;  Laterality: Right;   CATARACT EXTRACTION W/PHACO Left 03/18/2021   Procedure: CATARACT EXTRACTION PHACO AND INTRAOCULAR LENS PLACEMENT (IOC) LEFT RAYNER LENS;  Surgeon: Galen Manila, MD;  Location: [facility];  Service: Ophthalmology;  Laterality: Left;  15.74 1:21.8   LEFT HEART CATH AND CORONARY ANGIOGRAPHY Bilateral 07/23/2020   Procedure: LEFT HEART CATH AND CORONARY ANGIOGRAPHY;  Surgeon: Marcina Millard, MD;  Location: [facility];  Service: Cardiovascular;  Laterality: Bilateral;    (Not in a hospital admission)  Social History   Socioeconomic History   Marital status: Widowed    Spouse name: Not on file   Number of children: 2   Years of education: Not on file   Highest education level: Not on file  Occupational History   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    Social Determinants of Health   Financial Resource Strain: Not on file  Food Insecurity: Not on file  Transportation Needs: Not on file  Physical Activity: Not on file  Stress: Not on file  Social Connections: Not on file  Intimate Partner Violence: Not on file    Family History  Problem Relation Age of Onset   Dementia Mother    Lymphoma Father      Vitals:   02/16/22 1300 02/16/22 1315 02/16/22 1330 02/16/22 1331  BP: (!) 155/77  (!) 155/85   Pulse:  (!) 35    Resp: 19 (!) 22    Temp:    98.1 F (36.7 C)  TempSrc:    Oral  SpO2: 96% 92%    Weight:      Height:        PHYSICAL EXAM General: Elderly Caucasian male, well nourished, in no acute distress.  Laying in incline in  ED stretcher. HEENT:  Normocephalic and atraumatic. Neck:  No JVD.  Lungs: Normal respiratory effort on room air.  Bibasilar crackles without appreciable wheezes.   Heart: Bradycardic and irregular rhythm. Normal S1 and S2 without gallops or murmurs.  Abdomen: Non-distended appearing with excess adiposity.  Msk: Normal strength and tone for age. Extremities: Warm and well perfused. No clubbing, cyanosis.  No significant peripheral edema.  Neuro: Alert and oriented X 3. Psych:  Answers questions appropriately.   Labs: Basic Metabolic Panel: Recent Labs    02/15/22 2153 02/15/22 2157  NA 139  --   K 3.3*  --   CL 103  --   CO2 25  --   GLUCOSE 107*  --   BUN 18  --   CREATININE 1.14  --   CALCIUM 9.2  --   MG  --  2.0   Liver Function Tests: Recent Labs    02/15/22 2153  AST 47*  ALT 26  ALKPHOS 60  BILITOT 1.4*  PROT 8.2*  ALBUMIN 4.4   No results for input(s): "LIPASE", "AMYLASE" in the last 72 hours. CBC: Recent Labs    02/15/22 2153  WBC 6.9  NEUTROABS 4.7  HGB 16.3  HCT 49.3  MCV 91.3  PLT 167   Cardiac Enzymes: Recent Labs    02/15/22 2157 02/16/22 0123 02/16/22 0438  CKTOTAL 735*  --   --   TROPONINIHS  30* 27* 35*   BNP: Recent Labs    02/15/22 2157 02/16/22 0438  BNP 2,610.1* 2,214.0*   D-Dimer: Recent Labs    02/15/22 2157  DDIMER 2.23*   Hemoglobin A1C: No results for input(s): "HGBA1C" in the last 72 hours. Fasting Lipid Panel: No results for input(s): "CHOL", "HDL", "LDLCALC", "TRIG", "CHOLHDL", "LDLDIRECT" in the last 72 hours. Thyroid Function Tests: No results for input(s): "TSH", "T4TOTAL", "T3FREE", "THYROIDAB" in the last 72 hours.  Invalid input(s): "FREET3" Anemia Panel: Recent Labs    02/15/22 2157  FERRITIN 247     Radiology: CT Angio Chest Pulmonary Embolism (PE) W or WO Contrast  Result Date: 02/16/2022 CLINICAL DATA:  71 year old male with history of positive D-dimer. Suspected pulmonary embolism. EXAM: CT ANGIOGRAPHY CHEST WITH CONTRAST TECHNIQUE: Multidetector CT imaging of the chest was performed using the standard protocol during bolus administration of intravenous contrast. Multiplanar CT image reconstructions and MIPs were obtained to evaluate the vascular anatomy. RADIATION DOSE REDUCTION: This exam was performed according to the departmental dose-optimization program which includes automated exposure control, adjustment of the mA and/or kV according to patient size and/or use of iterative reconstruction technique. CONTRAST:  46mL OMNIPAQUE IOHEXOL 350 MG/ML SOLN COMPARISON:  None Available. FINDINGS: Cardiovascular: No filling defects are noted within the pulmonary arterial tree to suggest pulmonary embolism. Heart size is mildly enlarged. There is no significant pericardial fluid, thickening or pericardial calcification. There is aortic atherosclerosis, as well as atherosclerosis of the great vessels of the mediastinum and the coronary arteries, including calcified atherosclerotic plaque in the left main, left anterior descending, left circumflex and right coronary arteries. Mediastinum/Nodes: No pathologically enlarged mediastinal or hilar lymph nodes.  Esophagus is unremarkable in appearance. No axillary lymphadenopathy. Lungs/Pleura: Trace right pleural effusion lying dependently. No left pleural effusion. No suspicious appearing pulmonary nodules or masses are noted. No acute consolidative airspace disease. Upper Abdomen: Aortic atherosclerosis. Musculoskeletal: There are no aggressive appearing lytic or blastic lesions noted in the visualized portions of the skeleton. Review of the MIP images confirms the above findings. IMPRESSION: 1.  No evidence of pulmonary embolism. 2. Trace right pleural effusion lying dependently. 3. Mild cardiomegaly. 4. Aortic atherosclerosis, in addition to left main and three-vessel coronary artery disease. Assessment for potential risk factor modification, dietary therapy or pharmacologic therapy may be warranted, if clinically indicated. Aortic Atherosclerosis (ICD10-I70.0). Electronically Signed   By: Trudie Reed M.D.   On: 02/16/2022 05:05   DG Foot Complete Left  Result Date: 02/16/2022 CLINICAL DATA:  Fall EXAM: LEFT FOOT - COMPLETE 3+ VIEW COMPARISON:  None Available. FINDINGS: No acute bony abnormality. Specifically, no fracture, subluxation, or dislocation. 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 Soft tissues are intact. IMPRESSION: No acute bony abnormality. Electronically Signed   By: Charlett Nose M.D.   On: 02/16/2022 01:58   DG Ankle Complete Left  Result Date: 02/16/2022 CLINICAL DATA:  Fall EXAM: LEFT ANKLE COMPLETE - 3+ VIEW COMPARISON:  None Available. FINDINGS: Diffuse soft tissue swelling. Well corticated bone fragment adjacent to the lateral malleolus tip, likely related to old injury. No acute fracture, subluxation or dislocation. Joint spaces maintained. IMPRESSION: No acute bony abnormality. Electronically Signed   By: Charlett Nose M.D.   On: 02/16/2022 01:57   DG Chest Portable 1 View  Result Date: 02/15/2022 CLINICAL DATA:  Weakness EXAM: PORTABLE CHEST 1 VIEW COMPARISON:  None  Available. FINDINGS: The heart size and mediastinal contours are within normal limits. Both lungs are clear. The visualized skeletal structures are unremarkable. IMPRESSION: No active disease. Electronically Signed   By: Darliss Cheney M.D.   On: 02/15/2022 22:26    ECHO 06/16/2021 ECHOCARDIOGRAPHIC MEASUREMENTS  2D DIMENSIONS  AORTA                  Values   Normal Range   MAIN PA         Values    Normal Range                Annulus: nm*          [2.3-2.9]         PA Main: nm*       [1.5-2.1]              Aorta Sin: 3.4 cm       [3.1-3.7]    RIGHT VENTRICLE            ST Junction: nm*          [2.6-3.2]         RV Base: nm*       [<4.2]              Asc.Aorta: nm*          [2.6-3.4]          RV Mid: 3.0 cm    [<3.5]  LEFT VENTRICLE                                      RV Length: nm*       [<8.6]                  LVIDd: 6.4 cm       [4.2-5.9]    INFERIOR VENA CAVA                  LVIDs: 5.1 cm  Max. IVC: nm*       [<=2.1]                     FS: 20.0 %       [>25]            Min. IVC: nm*                    SWT: 1.1 cm       [0.6-1.0]    ------------------                    PWT: 1.1 cm       [0.6-1.0]    nm* - not measured  LEFT ATRIUM                LA Diam: 4.4 cm       [3.0-4.0]            LA A4C Area: nm*          [<20]              LA Volume: nm*          [18-58]  _________________________________________________________________________________________  ECHOCARDIOGRAPHIC DESCRIPTIONS  AORTIC ROOT                   Size: Normal             Dissection: INDETERM FOR DISSECTION  AORTIC VALVE               Leaflets: Tricuspid                   Morphology: MILDLY THICKENED               Mobility: Fully mobile  LEFT VENTRICLE                   Size: MILDLY ENLARGED               Anterior: HYPOCONTRACTILE            Contraction: REGIONALLY IMPAIRED            Lateral: HYPOCONTRACTILE             Closest EF: 35% (Estimated)                 Septal: HYPOCONTRACTILE               LV Masses: No Masses                       Apical: HYPOCONTRACTILE                    LVH: None                          Inferior: HYPOCONTRACTILE                                                      Posterior: HYPOCONTRACTILE           Dias.FxClass: (Grade 1) relaxation abnormal, E/A reversal  MITRAL VALVE               Leaflets: Normal  Mobility: Fully mobile             Morphology: Normal  LEFT ATRIUM                   Size: MILDLY ENLARGED              LA Masses: No masses              IA Septum: Normal IAS  MAIN PA                   Size: Normal  PULMONIC VALVE             Morphology: Normal                        Mobility: Fully mobile  RIGHT VENTRICLE              RV Masses: No Masses                         Size: Normal              Free Wall: Normal                     Contraction: Normal  TRICUSPID VALVE               Leaflets: Normal                        Mobility: Fully mobile             Morphology: Normal  RIGHT ATRIUM                   Size: Normal                        RA Other: None                RA Mass: No masses  PERICARDIUM                  Fluid: No effusion  INFERIOR VENACAVA                   Size: Normal Normal respiratory collapse  _________________________________________________________________________________________   DOPPLER ECHO and OTHER SPECIAL PROCEDURES                 Aortic: MILD AR                    No AS                         140.5 cm/sec peak vel      7.9 mmHg peak grad                         4.2 mmHg mean grad         2.0 cm^2 by DOPPLER                 Mitral: MILD MR                    No MS                         MV Inflow E Vel = 53.6 cm/sec  MV Annulus E'Vel = 5.3 cm/sec                         E/E'Ratio = 10.1              Tricuspid: TRIVIAL TR                 No TS                         191.4 cm/sec peak TR vel   17.7 mmHg peak RV pressure              Pulmonary: TRIVIAL PR                  No PS  _________________________________________________________________________________________  INTERPRETATION  MODERATE LV SYSTOLIC DYSFUNCTION (See above)  NORMAL RIGHT VENTRICULAR SYSTOLIC FUNCTION  MILD VALVULAR REGURGITATION (See above)  NO VALVULAR STENOSIS  EF ~ 35%  _________________________________________________________________________________________  Electronically signed by         Sena Slate, MD on 06/23/2021 12: 56 PM           Performed By: Mathis Bud, RVT     Ordering Physician: Sena Slate, MD   TELEMETRY reviewed by me (LT) 02/16/2022 : Mobitz type II second-degree AV block, heart rate 44-46 with increasing to the low 50s with conversation and what appears to be a second-degree AV block type I (Wenckebach)..  EKG reviewed by me: Mobitz type II second-degree AV block rate 40s  Data reviewed by me (LT) 02/16/2022: Last cardiology note, admission H&P, ED note, CBC, BMP, BNP, troponins, EKGs, telemetry  Principal Problem:   Mobitz type 2 second degree heart block Active Problems:   COVID-19 virus infection   Hypokalemia   Chronic systolic CHF (congestive heart failure) (HCC)   Controlled type 2 diabetes mellitus without complication, without long-term current use of insulin (HCC)   Elevated d-dimer   Dyslipidemia    ASSESSMENT AND PLAN:  Cole Collins is a 71yoM with a PMH of severe 3v CAD by [facility] 07/2020 for which referral to CT surgery for salvage CABG versus complex PCI was recommended, dilated cardiomyopathy (EF 35% 06/2021), type 2 diabetes, hypertension, hyperlipidemia, history of tobacco use who presented to [facility] ED 02/16/2022 with generalized weakness times several days saying his legs gave out on him, and a nonproductive cough that has since resolved.  He was found incidentally COVID-positive on presentation and has a new second-degree type II AV block on EKG for which cardiology is consulted for further assistance.  #COVID-19 #Mobitz type  II second-degree AV block initially, improving to a Wenckebach type II second-degree AV block the afternoon of 10/16 Presents with a 3-day history of generalized weakness, with significant weakness in his legs and upper arms, unable to get off the floor on the evening of 10/14 after sliding off of his bed.  Incidentally COVID-positive on admission, had a productive cough for several days prior to presentation that is improved now.  Initial EKG shows a Mobitz type II second-degree AV block, with heart rate in the 40s, carvedilol has been held and his heart rate has improved to the high 40s to low 50s on telemetry throughout the day. -Agree with current therapy per primary team, supportive care for COVID-19 infection -Hold carvedilol and AV nodal blockers -Monitor and replete electrolytes for a K >4, mag >2 -Continuous monitoring on telemetry -No indication for temporary transvenous pacemaker at  this time as patient is hypertensive -Discussed the indications, risks, and benefits of proceeding with a pacemaker should his heart rate and rhythm not improve after washout of his beta-blocker.  He prefers to treat his COVID infection, and see how he recovers from it before proceeding with any pacemaker if indicated.  We will revisit this with the patient tomorrow and review his telemetry.   #Acute on chronic HFrEF (EF 35% 06/2021) BNP significantly elevated at 2600 on presentation, he has bibasilar crackles and is clinically volume overloaded on exam.  Admits to eating fast food frequently. -S/p p.o. Lasix 40 mg x 1 -We will give 40 mg IV Lasix twice daily for now -Continue Entresto 24-26 mg twice daily -Hold beta-blocker as above -Strict I's/O -Briefly discussed defibrillator with the patient for primary prevention.  He remains hesitant to proceeding with this at this time.  #3v CAD #Elevated troponin Denies chest pain, troponins minimally elevated with a flat trend, most consistent with demand/supply  mismatch and not ACS.  -Continue aspirin, simvastatin -Ongoing discussions on an outpatient basis regarding management of his severe three-vessel disease.  This patient's plan of care was discussed and created with Dr. Gwen Pounds and he is in agreement.  Signed: Rebeca Allegra , PA-C 02/16/2022, 1:42 PM [facility]

## 2022-02-16 NOTE — ED Notes (Signed)
Pt up to restroom, 1 person assist required. Complaining of tenderness to left foot.

## 2022-02-17 ENCOUNTER — Other Ambulatory Visit: Payer: Self-pay

## 2022-02-17 DIAGNOSIS — E1165 Type 2 diabetes mellitus with hyperglycemia: Secondary | ICD-10-CM

## 2022-02-17 DIAGNOSIS — I441 Atrioventricular block, second degree: Secondary | ICD-10-CM | POA: Diagnosis not present

## 2022-02-17 DIAGNOSIS — U071 COVID-19: Secondary | ICD-10-CM | POA: Diagnosis not present

## 2022-02-17 DIAGNOSIS — R7989 Other specified abnormal findings of blood chemistry: Secondary | ICD-10-CM | POA: Diagnosis not present

## 2022-02-17 DIAGNOSIS — E876 Hypokalemia: Secondary | ICD-10-CM | POA: Diagnosis not present

## 2022-02-17 LAB — CBC WITH DIFFERENTIAL/PLATELET
Abs Immature Granulocytes: 0.01 10*3/uL (ref 0.00–0.07)
Basophils Absolute: 0 10*3/uL (ref 0.0–0.1)
Basophils Relative: 0 %
Eosinophils Absolute: 0 10*3/uL (ref 0.0–0.5)
Eosinophils Relative: 0 %
HCT: 46.9 % (ref 39.0–52.0)
Hemoglobin: 15.8 g/dL (ref 13.0–17.0)
Immature Granulocytes: 0 %
Lymphocytes Relative: 13 %
Lymphs Abs: 0.8 10*3/uL (ref 0.7–4.0)
MCH: 30.3 pg (ref 26.0–34.0)
MCHC: 33.7 g/dL (ref 30.0–36.0)
MCV: 89.8 fL (ref 80.0–100.0)
Monocytes Absolute: 0.9 10*3/uL (ref 0.1–1.0)
Monocytes Relative: 15 %
Neutro Abs: 4.1 10*3/uL (ref 1.7–7.7)
Neutrophils Relative %: 72 %
Platelets: 162 10*3/uL (ref 150–400)
RBC: 5.22 MIL/uL (ref 4.22–5.81)
RDW: 13.6 % (ref 11.5–15.5)
WBC: 5.7 10*3/uL (ref 4.0–10.5)
nRBC: 0 % (ref 0.0–0.2)

## 2022-02-17 LAB — COMPREHENSIVE METABOLIC PANEL
ALT: 23 U/L (ref 0–44)
AST: 40 U/L (ref 15–41)
Albumin: 3.8 g/dL (ref 3.5–5.0)
Alkaline Phosphatase: 49 U/L (ref 38–126)
Anion gap: 10 (ref 5–15)
BUN: 18 mg/dL (ref 8–23)
CO2: 20 mmol/L — ABNORMAL LOW (ref 22–32)
Calcium: 8.9 mg/dL (ref 8.9–10.3)
Chloride: 107 mmol/L (ref 98–111)
Creatinine, Ser: 0.92 mg/dL (ref 0.61–1.24)
GFR, Estimated: >60 mL/min (ref >60)
Glucose, Bld: 200 mg/dL — ABNORMAL HIGH (ref 70–99)
Potassium: 3.8 mmol/L (ref 3.5–5.1)
Sodium: 137 mmol/L (ref 135–145)
Total Bilirubin: 1.4 mg/dL — ABNORMAL HIGH (ref 0.3–1.2)
Total Protein: 7.2 g/dL (ref 6.5–8.1)

## 2022-02-17 LAB — CK: Total CK: 579 U/L — ABNORMAL HIGH (ref 49–397)

## 2022-02-17 LAB — D-DIMER, QUANTITATIVE: D-Dimer, Quant: 0.99 ug/mL-FEU — ABNORMAL HIGH (ref 0.00–0.50)

## 2022-02-17 LAB — GLUCOSE, CAPILLARY
Glucose-Capillary: 225 mg/dL — ABNORMAL HIGH (ref 70–99)
Glucose-Capillary: 260 mg/dL — ABNORMAL HIGH (ref 70–99)

## 2022-02-17 LAB — HEMOGLOBIN A1C
Hgb A1c MFr Bld: 6.9 % — ABNORMAL HIGH (ref 4.8–5.6)
Mean Plasma Glucose: 151.33 mg/dL

## 2022-02-17 LAB — FERRITIN: Ferritin: 278 ng/mL (ref 24–336)

## 2022-02-17 MED ORDER — SPIRONOLACTONE 12.5 MG HALF TABLET
12.5000 mg | ORAL_TABLET | Freq: Every day | ORAL | Status: DC
  Administered 2022-02-17: 12.5 mg via ORAL
  Filled 2022-02-17 (×2): qty 1

## 2022-02-17 MED ORDER — INSULIN ASPART 100 UNIT/ML IJ SOLN
0.0000 [IU] | Freq: Three times a day (TID) | INTRAMUSCULAR | Status: DC
  Filled 2022-02-17: qty 1

## 2022-02-17 MED ORDER — INSULIN ASPART 100 UNIT/ML IJ SOLN: 0.0000 [IU] | Freq: Every day | INTRAMUSCULAR | Status: DC

## 2022-02-17 MED ORDER — FUROSEMIDE 10 MG/ML IJ SOLN
40.0000 mg | Freq: Two times a day (BID) | INTRAMUSCULAR | Status: DC
  Administered 2022-02-17 (×2): 40 mg via INTRAVENOUS
  Filled 2022-02-17 (×3): qty 4

## 2022-02-17 NOTE — ED Notes (Signed)
Patient transported to floor at this time via wheelchair.  All personal belongings transported with patient.

## 2022-02-17 NOTE — Progress Notes (Signed)
Progress Note   Patient: Cole Collins QQP:[id number] DOB: 07-18-1950 DOA: 02/16/2022     1 DOS: the patient was seen and examined on 02/17/2022   Brief hospital course: Cole Collins is a 71 y.o. Caucasian male with medical history significant for systolic CHF with EF of 30 to 71%, type II diabetes mellitus and hypertension, who presented to the emergency room with onset of generalized weakness for the last few days with associated cough productive of clear sputum.  His legs gave out from underneath him on Saturday and he fell to the ground without head injuries or loss of consciousness.  He stated that he injured his left ankle as he felt a "pop".  X-rays were negative.  Patient was found to have COVID-positive infection.  Paxlovid started.  Patient also had bradycardia where his Coreg was stopped.  Initially had Wenckebach heart block but then converted to second-degree heart block type II.  Cardiology was consulted.  Cardiology just wanted to watch things.  Patient feeling better on 02/17/2022.  Assessment and Plan: * Mobitz type 2 second degree heart block Appreciate cardiology consultation.  Coreg held.  As per cardiology Mobitz type II second-degree heart block converted to Mobitz type I second-degree heart block.  Continue to monitor on telemetry.  COVID-19 virus infection Continue Paxlovid  Hypokalemia Replaced  Elevated d-dimer CT scan of the chest negative for pulmonary embolism.  D-dimer trended down likely elevated with COVID infection.  Chronic systolic CHF (congestive heart failure) (HCC) Continue Entresto and Lasix.  Holding Coreg with type II heart block.  Dyslipidemia Hold Crestor with elevated CPK and being on Paxlovid.  Uncontrolled type 2 diabetes mellitus with hyperglycemia, without long-term current use of insulin (HCC) Patient on glipizide.  Also placed on sliding scale and add on hemoglobin A1c.        Subjective: Patient feeling much better.  He  walked back from the bathroom to the chair.  Offers no complaints today.  Physical Exam: Vitals:   02/17/22 0343 02/17/22 0435 02/17/22 0908 02/17/22 1200  BP: (!) 126/90 (!) 143/94 130/88 (!) 148/86  Pulse: 64 (!) 57 62 67  Resp: 18  19 18   Temp: (!) 97.4 F (36.3 C)  97.8 F (36.6 C) (!) 97.5 F (36.4 C)  TempSrc: Axillary  Oral Axillary  SpO2: 95% 97% 97% 99%  Weight:      Height:       Physical Exam HENT:     Head: Normocephalic.  Eyes:     General: Lids are normal.     Conjunctiva/sclera: Conjunctivae normal.  Cardiovascular:     Rate and Rhythm: Regular rhythm. Bradycardia present.     Heart sounds: Normal heart sounds, S1 normal and S2 normal.  Pulmonary:     Breath sounds: No decreased breath sounds, wheezing, rhonchi or rales.  Abdominal:     Palpations: Abdomen is soft.     Tenderness: There is no abdominal tenderness.  Musculoskeletal:     Right lower leg: No swelling.     Left lower leg: No swelling.  Skin:    General: Skin is warm.     Findings: No rash.  Neurological:     Mental Status: He is alert and oriented to person, place, and time.     Data Reviewed: Glucose 200 on chemistry, total bilirubin 1.4, CPK 579 ferritin 278, CRP 2.1, CBC normal range  Family Communication: Spoke with son on the phone  Disposition: Status is: Inpatient Remains inpatient appropriate because: Monitoring  heart rhythm today  Planned Discharge Destination: Home    Time spent: 27 minutes Case discussed with cardiology team  Author: Loletha Grayer, MD 02/17/2022 1:13 PM  For on call review [email or web address].

## 2022-02-17 NOTE — Progress Notes (Signed)
Review sliding scale insulin.  I discontinued the order.

## 2022-02-17 NOTE — Progress Notes (Signed)
[facility] CLINIC CARDIOLOGY CONSULT NOTE       Patient ID: Cole Collins MRN: [id number] DOB/AGE: Jul 24, 1950 71 y.o.  Admit date: 02/16/2022 Referring Physician Dr. Renae Gloss Primary Physician Dr. Letitia Libra Primary Cardiologist Previous Dr. Beatrix Fetters / Dr. Lady Gary Reason for Consultation new 2nd Degree Type 2 AV block   HPI: Cole Collins. Cole Collins is a 71yoM with a PMH of severe 3v CAD by [facility] 07/2020 for which referral to CT surgery for salvage CABG versus complex PCI was recommended, dilated cardiomyopathy (EF 35% 06/2021), type 2 diabetes, hypertension, hyperlipidemia, history of tobacco use who presented to [facility] ED 02/16/2022 with generalized weakness for several days saying his legs gave out on him, and a nonproductive cough that has since resolved.  He was found incidentally COVID-positive on presentation and has a new second-degree type II AV block on EKG for which cardiology is consulted for further assistance.  Interval history: -Remains in second-degree type I (Wenckebach) AV block with rates 46-59 on telemetry after holding his carvedilol -feels much better today, ambulating in room without difficulty. Shortness of breath has improved  -no chest pain or peripheral edema, lightheadedness or dizziness -discussed his medications at length   Review of systems complete and found to be negative unless listed above     Past Medical History:  Diagnosis Date   CHF (congestive heart failure) (HCC)    Diabetes (HCC) 2019   Hypertension 2019    Past Surgical History:  Procedure Laterality Date   CATARACT EXTRACTION W/PHACO Right 03/04/2021   Procedure: CATARACT EXTRACTION PHACO AND INTRAOCULAR LENS PLACEMENT (IOC) RIGHT RAYNOR LENS 19.58 01:31.7;  Surgeon: Galen Manila, MD;  Location: [facility];  Service: Ophthalmology;  Laterality: Right;   CATARACT EXTRACTION W/PHACO Left 03/18/2021   Procedure: CATARACT EXTRACTION PHACO AND INTRAOCULAR LENS PLACEMENT (IOC) LEFT RAYNER LENS;   Surgeon: Galen Manila, MD;  Location: [facility];  Service: Ophthalmology;  Laterality: Left;  15.74 1:21.8   LEFT HEART CATH AND CORONARY ANGIOGRAPHY Bilateral 07/23/2020   Procedure: LEFT HEART CATH AND CORONARY ANGIOGRAPHY;  Surgeon: Marcina Millard, MD;  Location: [facility] INVASIVE CV LAB;  Service: Cardiovascular;  Laterality: Bilateral;    Medications Prior to Admission  Medication Sig Dispense Refill Last Dose   aspirin 81 MG chewable tablet Chew by mouth.   Past Week   carvedilol (COREG) 12.5 MG tablet Take 12.5 mg by mouth 2 (two) times daily with a meal.   Past Week   ENTRESTO 49-51 MG SMARTSIG:1 Tablet(s) By Mouth Every 12 Hours   Past Week   furosemide (LASIX) 20 MG tablet Take 1 tablet (20 mg total) by mouth daily. (Patient taking differently: Take 40 mg by mouth 2 (two) times daily.) 30 tablet 1 Past Week   glipiZIDE (GLUCOTROL) 5 MG tablet Take 5 mg by mouth daily before breakfast.   Past Week   rosuvastatin (CRESTOR) 20 MG tablet Take 20 mg by mouth daily.   Past Week   traZODone (DESYREL) 50 MG tablet Take 50 mg by mouth at bedtime.   Past Week    Social History   Socioeconomic History   Marital status: Widowed    Spouse name: Not on file   Number of children: 2   Years of education: Not on file   Highest education level: Not on file  Occupational History   Occupation: Driver Ed Runner, broadcasting/film/video  Tobacco Use   Smoking status: Former    Packs/day: 0.50    Years: 50.00    Total pack years:  25.00    Types: Cigarettes    Quit date: 04/06/2020    Years since quitting: 1.8   Smokeless tobacco: Never  Vaping Use   Vaping Use: Never used  Substance and Sexual Activity   Alcohol use: Not Currently   Drug use: Never   Sexual activity: Not on file  Other Topics Concern   Not on file  Social History Narrative   Lives by himself.    Social Determinants of Health   Financial Resource Strain: Not on file  Food Insecurity: No Food Insecurity (02/17/2022)    Hunger Vital Sign    Worried About Running Out of Food in the Last Year: Never true    Ran Out of Food in the Last Year: Never true  Transportation Needs: No Transportation Needs (02/17/2022)   PRAPARE - Administrator, Civil Service (Medical): No    Lack of Transportation (Non-Medical): No  Physical Activity: Not on file  Stress: Not on file  Social Connections: Not on file  Intimate Partner Violence: Not At Risk (02/17/2022)   Humiliation, Afraid, Rape, and Kick questionnaire    Fear of Current or Ex-Partner: No    Emotionally Abused: No    Physically Abused: No    Sexually Abused: No    Family History  Problem Relation Age of Onset   Dementia Mother    Lymphoma Father      Vitals:   02/17/22 0000 02/17/22 0343 02/17/22 0435 02/17/22 0908  BP: 135/63 (!) 126/90 (!) 143/94 130/88  Pulse: (!) 44 64 (!) 57 62  Resp: (!) Temp:  (!) 97.4 F (36.3 C)  97.8 F (36.6 C)  TempSrc:  Axillary  Oral  SpO2: 94% 95% 97% 97%  Weight:      Height:        PHYSICAL EXAM General: Elderly Caucasian male, well nourished, in no acute distress. Seen standing up in room, ambulating to recliner HEENT:  Normocephalic and atraumatic. Neck:  No JVD.  Lungs: Normal respiratory effort on room air.  Decreased breath sounds with trace crackles. Heart: Bradycardic and irregular rhythm. Normal S1 and S2 without gallops or murmurs.  Abdomen: Non-distended appearing with excess adiposity.  Msk: Normal strength and tone for age. Extremities: Warm and well perfused. No clubbing, cyanosis.  No significant peripheral edema.  Neuro: Alert and oriented X 3. Psych:  Answers questions appropriately.   Labs: Basic Metabolic Panel: Recent Labs    02/15/22 2153 02/15/22 2157 02/17/22 0631  NA 139  --  137  K 3.3*  --  3.8  CL 103  --  107  CO2 25  --  20*  GLUCOSE 107*  --  200*  BUN 18  --  18  CREATININE 1.14  --  0.92  CALCIUM 9.2  --  8.9  MG  --  2.0  --     Liver  Function Tests: Recent Labs    02/15/22 2153 02/17/22 0631  AST 47* 40  ALT 26 23  ALKPHOS 60 49  BILITOT 1.4* 1.4*  PROT 8.2* 7.2  ALBUMIN 4.4 3.8    No results for input(s): "LIPASE", "AMYLASE" in the last 72 hours. CBC: Recent Labs    02/15/22 2153 02/17/22 0631  WBC 6.9 5.7  NEUTROABS 4.7 4.1  HGB 16.3 15.8  HCT 49.3 46.9  MCV 91.3 89.8  PLT 167 162    Cardiac Enzymes: Recent Labs    02/15/22 2157 02/16/22 0123 02/16/22 0438  02/17/22 0631  CKTOTAL 735*  --   --  579*  TROPONINIHS 30* 27* 35*  --     BNP: Recent Labs    02/15/22 2157 02/16/22 0438  BNP 2,610.1* 2,214.0*    D-Dimer: Recent Labs    02/15/22 2157 02/17/22 0631  DDIMER 2.23* 0.99*    Hemoglobin A1C: No results for input(s): "HGBA1C" in the last 72 hours. Fasting Lipid Panel: No results for input(s): "CHOL", "HDL", "LDLCALC", "TRIG", "CHOLHDL", "LDLDIRECT" in the last 72 hours. Thyroid Function Tests: No results for input(s): "TSH", "T4TOTAL", "T3FREE", "THYROIDAB" in the last 72 hours.  Invalid input(s): "FREET3" Anemia Panel: Recent Labs    02/17/22 0631  FERRITIN 278      Radiology: CT Angio Chest Pulmonary Embolism (PE) W or WO Contrast  Result Date: 02/16/2022 CLINICAL DATA:  71 year old male with history of positive D-dimer. Suspected pulmonary embolism. EXAM: CT ANGIOGRAPHY CHEST WITH CONTRAST TECHNIQUE: Multidetector CT imaging of the chest was performed using the standard protocol during bolus administration of intravenous contrast. Multiplanar CT image reconstructions and MIPs were obtained to evaluate the vascular anatomy. RADIATION DOSE REDUCTION: This exam was performed according to the departmental dose-optimization program which includes automated exposure control, adjustment of the mA and/or kV according to patient size and/or use of iterative reconstruction technique. CONTRAST:  75mL OMNIPAQUE IOHEXOL 350 MG/ML SOLN COMPARISON:  None Available. FINDINGS:  Cardiovascular: No filling defects are noted within the pulmonary arterial tree to suggest pulmonary embolism. Heart size is mildly enlarged. There is no significant pericardial fluid, thickening or pericardial calcification. There is aortic atherosclerosis, as well as atherosclerosis of the great vessels of the mediastinum and the coronary arteries, including calcified atherosclerotic plaque in the left main, left anterior descending, left circumflex and right coronary arteries. Mediastinum/Nodes: No pathologically enlarged mediastinal or hilar lymph nodes. Esophagus is unremarkable in appearance. No axillary lymphadenopathy. Lungs/Pleura: Trace right pleural effusion lying dependently. No left pleural effusion. No suspicious appearing pulmonary nodules or masses are noted. No acute consolidative airspace disease. Upper Abdomen: Aortic atherosclerosis. Musculoskeletal: There are no aggressive appearing lytic or blastic lesions noted in the visualized portions of the skeleton. Review of the MIP images confirms the above findings. IMPRESSION: 1. No evidence of pulmonary embolism. 2. Trace right pleural effusion lying dependently. 3. Mild cardiomegaly. 4. Aortic atherosclerosis, in addition to left main and three-vessel coronary artery disease. Assessment for potential risk factor modification, dietary therapy or pharmacologic therapy may be warranted, if clinically indicated. Aortic Atherosclerosis (ICD10-I70.0). Electronically Signed   By: Trudie Reed M.D.   On: 02/16/2022 05:05   DG Foot Complete Left  Result Date: 02/16/2022 CLINICAL DATA:  Fall EXAM: LEFT FOOT - COMPLETE 3+ VIEW COMPARISON:  None Available. FINDINGS: No acute bony abnormality. Specifically, no fracture, subluxation, or dislocation. 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 Soft tissues are intact. IMPRESSION: No acute bony abnormality. Electronically Signed   By: Charlett Nose M.D.   On: 02/16/2022 01:58   DG Ankle Complete  Left  Result Date: 02/16/2022 CLINICAL DATA:  Fall EXAM: LEFT ANKLE COMPLETE - 3+ VIEW COMPARISON:  None Available. FINDINGS: Diffuse soft tissue swelling. Well corticated bone fragment adjacent to the lateral malleolus tip, likely related to old injury. No acute fracture, subluxation or dislocation. Joint spaces maintained. IMPRESSION: No acute bony abnormality. Electronically Signed   By: Charlett Nose M.D.   On: 02/16/2022 01:57   DG Chest Portable 1 View  Result Date: 02/15/2022 CLINICAL DATA:  Weakness EXAM: PORTABLE CHEST 1  VIEW COMPARISON:  None Available. FINDINGS: The heart size and mediastinal contours are within normal limits. Both lungs are clear. The visualized skeletal structures are unremarkable. IMPRESSION: No active disease. Electronically Signed   By: Darliss Cheney M.D.   On: 02/15/2022 22:26    ECHO 06/16/2021 ECHOCARDIOGRAPHIC MEASUREMENTS  2D DIMENSIONS  AORTA                  Values   Normal Range   MAIN PA         Values    Normal Range                Annulus: nm*          [2.3-2.9]         PA Main: nm*       [1.5-2.1]              Aorta Sin: 3.4 cm       [3.1-3.7]    RIGHT VENTRICLE            ST Junction: nm*          [2.6-3.2]         RV Base: nm*       [<4.2]              Asc.Aorta: nm*          [2.6-3.4]          RV Mid: 3.0 cm    [<3.5]  LEFT VENTRICLE                                      RV Length: nm*       [<8.6]                  LVIDd: 6.4 cm       [4.2-5.9]    INFERIOR VENA CAVA                  LVIDs: 5.1 cm                        Max. IVC: nm*       [<=2.1]                     FS: 20.0 %       [>25]            Min. IVC: nm*                    SWT: 1.1 cm       [0.6-1.0]    ------------------                    PWT: 1.1 cm       [0.6-1.0]    nm* - not measured  LEFT ATRIUM                LA Diam: 4.4 cm       [3.0-4.0]            LA A4C Area: nm*          [<20]              LA Volume: nm*          [18-58]   _________________________________________________________________________________________  ECHOCARDIOGRAPHIC DESCRIPTIONS  AORTIC ROOT  Size: Normal             Dissection: INDETERM FOR DISSECTION  AORTIC VALVE               Leaflets: Tricuspid                   Morphology: MILDLY THICKENED               Mobility: Fully mobile  LEFT VENTRICLE                   Size: MILDLY ENLARGED               Anterior: HYPOCONTRACTILE            Contraction: REGIONALLY IMPAIRED            Lateral: HYPOCONTRACTILE             Closest EF: 35% (Estimated)                 Septal: HYPOCONTRACTILE              LV Masses: No Masses                       Apical: HYPOCONTRACTILE                    LVH: None                          Inferior: HYPOCONTRACTILE                                                      Posterior: HYPOCONTRACTILE           Dias.FxClass: (Grade 1) relaxation abnormal, E/A reversal  MITRAL VALVE               Leaflets: Normal                        Mobility: Fully mobile             Morphology: Normal  LEFT ATRIUM                   Size: MILDLY ENLARGED              LA Masses: No masses              IA Septum: Normal IAS  MAIN PA                   Size: Normal  PULMONIC VALVE             Morphology: Normal                        Mobility: Fully mobile  RIGHT VENTRICLE              RV Masses: No Masses                         Size: Normal              Free Wall: Normal  Contraction: Normal  TRICUSPID VALVE               Leaflets: Normal                        Mobility: Fully mobile             Morphology: Normal  RIGHT ATRIUM                   Size: Normal                        RA Other: None                RA Mass: No masses  PERICARDIUM                  Fluid: No effusion  INFERIOR VENACAVA                   Size: Normal Normal respiratory collapse  _________________________________________________________________________________________    DOPPLER ECHO and OTHER SPECIAL PROCEDURES                 Aortic: MILD AR                    No AS                         140.5 cm/sec peak vel      7.9 mmHg peak grad                         4.2 mmHg mean grad         2.0 cm^2 by DOPPLER                 Mitral: MILD MR                    No MS                         MV Inflow E Vel = 53.6 cm/sec     MV Annulus E'Vel = 5.3 cm/sec                         E/E'Ratio = 10.1              Tricuspid: TRIVIAL TR                 No TS                         191.4 cm/sec peak TR vel   17.7 mmHg peak RV pressure              Pulmonary: TRIVIAL PR                 No PS  _________________________________________________________________________________________  INTERPRETATION  MODERATE LV SYSTOLIC DYSFUNCTION (See above)  NORMAL RIGHT VENTRICULAR SYSTOLIC FUNCTION  MILD VALVULAR REGURGITATION (See above)  NO VALVULAR STENOSIS  EF ~ 35%  _________________________________________________________________________________________  Electronically signed by         Sena Slateyan Orgel, MD on 06/23/2021 12: 56 PM           Performed By: Mathis BudMartin, Matthew, RDCS, RVT     Ordering Physician: Sena Slatergel, Ryan, MD   TELEMETRY reviewed by  me (LT) 02/17/2022 : Mobitz type II second-degree AV block, heart rate 44-46 with increasing to the low 50s with conversation and what appears to be a second-degree AV block type I (Wenckebach)..  EKG reviewed by me: Mobitz type II second-degree AV block rate 40s  Data reviewed by me (LT) 02/17/2022: Last cardiology note, admission H&P, ED note, CBC, BMP, BNP, troponins, EKGs, telemetry  Principal Problem:   Mobitz type 2 second degree heart block Active Problems:   COVID-19 virus infection   Hypokalemia   Chronic systolic CHF (congestive heart failure) (HCC)   Controlled type 2 diabetes mellitus without complication, without long-term current use of insulin (HCC)   Elevated d-dimer   Dyslipidemia    ASSESSMENT AND PLAN:   Kdyn Vonbehren. Celani is a 71yoM with a PMH of severe 3v CAD by [facility] 07/2020 for which referral to CT surgery for salvage CABG versus complex PCI was recommended, dilated cardiomyopathy (EF 35% 06/2021), type 2 diabetes, hypertension, hyperlipidemia, history of tobacco use who presented to [facility] ED 02/16/2022 with generalized weakness times several days saying his legs gave out on him, and a nonproductive cough that has since resolved.  He was found incidentally COVID-positive on presentation and has a new second-degree type II AV block on EKG for which cardiology is consulted for further assistance.  #COVID-19 #Mobitz type II second-degree AV block initially, improving to a Wenckebach type I second-degree AV block the afternoon of 10/16 Presents with a 3-day history of generalized weakness, with significant weakness in his legs and upper arms, unable to get off the floor on the evening of 10/14 after sliding off of his bed.  Incidentally COVID-positive on admission, had a productive cough for several days prior to presentation that is improved now.  Initial EKG shows a Mobitz type II second-degree AV block, with heart rate in the 40s, carvedilol has been held and his heart rate has improved to the high 40s to low 50s on telemetry throughout the day. -Agree with current therapy per primary team, supportive care for COVID-19 infection -Hold carvedilol and AV nodal blockers, do not restart -Monitor and replete electrolytes for a K >4, mag >2 -Continuous monitoring on telemetry -No indication for temporary transvenous pacemaker  or permanent at this time  -consider further evaluation of inducible arrhythmias on an outpatient basis with an ETT   #Acute on chronic HFrEF (EF 35% 06/2021) BNP significantly elevated at 2600 on presentation, he has bibasilar crackles and is clinically volume overloaded on exam.  Admits to eating fast food frequently. -S/p p.o. Lasix 40 mg x 2 -ordered 40 mg IV Lasix twice daily x 2  doses, monitor response -Continue Entresto 24-26 mg twice daily -add spironolactone 12.5mg  for GDMT, consider jardiance on an outpatient basis  -Hold beta-blocker as above -Strict I's/O -Briefly discussed defibrillator with the patient for primary prevention.  He remains hesitant to proceeding with this at this time.  #3v CAD #Elevated troponin Denies chest pain, troponins minimally elevated with a flat trend, most consistent with demand/supply mismatch and not ACS.  -Continue aspirin, simvastatin -discussed the importance of compliance with his medicines, including aspirin -Ongoing discussions on an outpatient basis regarding management of his severe three-vessel disease.  This patient's plan of care was discussed and created with Dr. Gwen Pounds and he is in agreement.  Signed: Rebeca Allegra , PA-C 02/17/2022, 11:25 AM [facility] Cardiology

## 2022-02-17 NOTE — Hospital Course (Signed)
Cole Collins is a 71 y.o. Caucasian male with medical history significant for systolic CHF with EF of 30 to 35%, type II diabetes mellitus and hypertension, who presented to the emergency room with onset of generalized weakness for the last few days with associated cough productive of clear sputum.  His legs gave out from underneath him on Saturday and he fell to the ground without head injuries or loss of consciousness.  He stated that he injured his left ankle as he felt a "pop".  X-rays were negative.  Patient was found to have COVID-positive infection.  Paxlovid started.  Patient also had bradycardia where his Coreg was stopped.  Initially had Wenckebach heart block but then converted to second-degree heart block type II.  Cardiology was consulted.  Cardiology just wanted to watch things.  Patient feeling better on 02/17/2022.

## 2022-02-17 NOTE — Evaluation (Signed)
Physical Therapy Evaluation Patient Details Name: Cole Collins MRN: [id number] DOB: Sep 20, 1950 Today's Date: 02/17/2022  History of Present Illness  Pt is a 71 yo M diagnosed with Covid-19, Mobitz type I second-degree heart block, elevated d-dimer likely secondary to Covid with (-) PE, and hyponatremia. PMH includes CHF, DM, and HTN.   Clinical Impression  Pt was pleasant and motivated to participate during the session and put forth good effort throughout. Pt independent with all functional tasks including amb 100'.  Good control and stability with gait including during quick 180 deg turns in tight spaces without an AD; pt conversational throughout ambulation with no SOB and with SpO2 98-99% and HR WNL measured frequently during the session. Pt reports feeling very near/at functional baseline with no skilled PT needs.  Will complete PT orders at this time but will reassess pt pending a change in status upon receipt of new PT orders.         Recommendations for follow up therapy are one component of a multi-disciplinary discharge planning process, led by the attending physician.  Recommendations may be updated based on patient status, additional functional criteria and insurance authorization.  Follow Up Recommendations No PT follow up      Assistance Recommended at Discharge None  Patient can return home with the following  Assist for transportation    Equipment Recommendations None recommended by PT  Recommendations for Other Services       Functional Status Assessment Patient has not had a recent decline in their functional status     Precautions / Restrictions Precautions Precautions: Fall Restrictions Weight Bearing Restrictions: No      Mobility  Bed Mobility               General bed mobility comments: NT, pt in recliner    Transfers Overall transfer level: Independent                 General transfer comment: Good eccentric and concentric control  and stability    Ambulation/Gait Ambulation/Gait assistance: Independent Gait Distance (Feet): 100 Feet Assistive device: None Gait Pattern/deviations: WFL(Within Functional Limits) Gait velocity: WNL     General Gait Details: Good control and stability including during quick 180 deg turns in tight spaces without an AD; pt conversational throughout ambulation with no SOB and with SpO2 99% and HR WNL throughout  Stairs            Wheelchair Mobility    Modified Rankin (Stroke Patients Only)       Balance Overall balance assessment: No apparent balance deficits (not formally assessed)                                           Pertinent Vitals/Pain Pain Assessment Pain Assessment: No/denies pain    Home Living Family/patient expects to be discharged to:: Private residence Living Arrangements: Alone Available Help at Discharge: Family;Available PRN/intermittently Type of Home: House Home Access: Stairs to enter Entrance Stairs-Rails: Right Entrance Stairs-Number of Steps: 3   Home Layout: One level Home Equipment: None Additional Comments: Pt has 2 sons that can help as needed    Prior Function Prior Level of Function : Independent/Modified Independent             Mobility Comments: Ind amb community distances without an AD, no fall history other than current fall associated with weakness from Covid-19 infection  ADLs Comments: Ind with ADLs     Hand Dominance        Extremity/Trunk Assessment   Upper Extremity Assessment Upper Extremity Assessment: Overall WFL for tasks assessed    Lower Extremity Assessment Lower Extremity Assessment: Overall WFL for tasks assessed       Communication   Communication: No difficulties  Cognition Arousal/Alertness: Awake/alert Behavior During Therapy: WFL for tasks assessed/performed Overall Cognitive Status: Within Functional Limits for tasks assessed                                           General Comments      Exercises     Assessment/Plan    PT Assessment Patient does not need any further PT services  PT Problem List         PT Treatment Interventions      PT Goals (Current goals can be found in the Care Plan section)  Acute Rehab PT Goals PT Goal Formulation: All assessment and education complete, DC therapy    Frequency       Co-evaluation               AM-PAC PT "6 Clicks" Mobility  Outcome Measure Help needed turning from your back to your side while in a flat bed without using bedrails?: None Help needed moving from lying on your back to sitting on the side of a flat bed without using bedrails?: None Help needed moving to and from a bed to a chair (including a wheelchair)?: None Help needed standing up from a chair using your arms (e.g., wheelchair or bedside chair)?: None Help needed to walk in hospital room?: None Help needed climbing 3-5 steps with a railing? : None 6 Click Score: 24    End of Session Equipment Utilized During Treatment: Gait belt Activity Tolerance: Patient tolerated treatment well Patient left: in chair;with call bell/phone within reach Nurse Communication: Mobility status PT Visit Diagnosis: Muscle weakness (generalized) (M62.81)    Time: 1275-1700 PT Time Calculation (min) (ACUTE ONLY): 50 min   Charges:   PT Evaluation $PT Eval Low Complexity: 1 Low PT Treatments $Therapeutic Activity: 8-22 mins        D. Elly Modena PT, DPT 02/17/22, 4:23 PM

## 2022-02-17 NOTE — ED Notes (Signed)
Patient currently awake and alert.  No complaints voiced.  States he is feeling "better" and no longer feels weak.  Provided with a new gown and personal hygiene supplies per request

## 2022-02-18 DIAGNOSIS — I441 Atrioventricular block, second degree: Secondary | ICD-10-CM | POA: Diagnosis not present

## 2022-02-18 LAB — CBC WITH DIFFERENTIAL/PLATELET
Abs Immature Granulocytes: 0.03 10*3/uL (ref 0.00–0.07)
Basophils Absolute: 0 10*3/uL (ref 0.0–0.1)
Basophils Relative: 0 %
Eosinophils Absolute: 0 10*3/uL (ref 0.0–0.5)
Eosinophils Relative: 0 %
HCT: 47.6 % (ref 39.0–52.0)
Hemoglobin: 16.1 g/dL (ref 13.0–17.0)
Immature Granulocytes: 1 %
Lymphocytes Relative: 13 %
Lymphs Abs: 0.8 10*3/uL (ref 0.7–4.0)
MCH: 30.4 pg (ref 26.0–34.0)
MCHC: 33.8 g/dL (ref 30.0–36.0)
MCV: 90 fL (ref 80.0–100.0)
Monocytes Absolute: 0.9 10*3/uL (ref 0.1–1.0)
Monocytes Relative: 14 %
Neutro Abs: 4.7 10*3/uL (ref 1.7–7.7)
Neutrophils Relative %: 72 %
Platelets: 151 10*3/uL (ref 150–400)
RBC: 5.29 MIL/uL (ref 4.22–5.81)
RDW: 13.5 % (ref 11.5–15.5)
WBC: 6.5 10*3/uL (ref 4.0–10.5)
nRBC: 0 % (ref 0.0–0.2)

## 2022-02-18 LAB — COMPREHENSIVE METABOLIC PANEL
ALT: 20 U/L (ref 0–44)
AST: 36 U/L (ref 15–41)
Albumin: 3.4 g/dL — ABNORMAL LOW (ref 3.5–5.0)
Alkaline Phosphatase: 44 U/L (ref 38–126)
Anion gap: 8 (ref 5–15)
BUN: 22 mg/dL (ref 8–23)
CO2: 24 mmol/L (ref 22–32)
Calcium: 8.3 mg/dL — ABNORMAL LOW (ref 8.9–10.3)
Chloride: 105 mmol/L (ref 98–111)
Creatinine, Ser: 0.95 mg/dL (ref 0.61–1.24)
GFR, Estimated: >60 mL/min (ref >60)
Glucose, Bld: 147 mg/dL — ABNORMAL HIGH (ref 70–99)
Potassium: 3.2 mmol/L — ABNORMAL LOW (ref 3.5–5.1)
Sodium: 137 mmol/L (ref 135–145)
Total Bilirubin: 1.3 mg/dL — ABNORMAL HIGH (ref 0.3–1.2)
Total Protein: 6.8 g/dL (ref 6.5–8.1)

## 2022-02-18 LAB — GLUCOSE, CAPILLARY
Glucose-Capillary: 142 mg/dL — ABNORMAL HIGH (ref 70–99)
Glucose-Capillary: 193 mg/dL — ABNORMAL HIGH (ref 70–99)

## 2022-02-18 LAB — CK: Total CK: 414 U/L — ABNORMAL HIGH (ref 49–397)

## 2022-02-18 LAB — MAGNESIUM: Magnesium: 2.3 mg/dL (ref 1.7–2.4)

## 2022-02-18 MED ORDER — SPIRONOLACTONE 25 MG PO TABS: 12.5000 mg | ORAL_TABLET | Freq: Every day | ORAL | 0 refills | Status: AC

## 2022-02-18 MED ORDER — ENTRESTO 24-26 MG PO TABS: 1.0000 | ORAL_TABLET | Freq: Two times a day (BID) | ORAL | 0 refills | Status: AC

## 2022-02-18 MED ORDER — FUROSEMIDE 10 MG/ML IJ SOLN
40.0000 mg | Freq: Once | INTRAMUSCULAR | Status: AC
  Administered 2022-02-18: 40 mg via INTRAVENOUS

## 2022-02-18 MED ORDER — NIRMATRELVIR/RITONAVIR (PAXLOVID)TABLET: 3.0000 | ORAL_TABLET | Freq: Two times a day (BID) | ORAL | 0 refills | Status: AC

## 2022-02-18 MED ORDER — POTASSIUM CHLORIDE CRYS ER 20 MEQ PO TBCR
20.0000 meq | EXTENDED_RELEASE_TABLET | Freq: Once | ORAL | Status: AC
  Administered 2022-02-18: 20 meq via ORAL
  Filled 2022-02-18: qty 1

## 2022-02-18 NOTE — Progress Notes (Signed)
Patient discharged. All belongings with pt. 

## 2022-02-18 NOTE — Progress Notes (Addendum)
Patient was seen evaluated and examined by me and the PA on 02/18/22.  Course of action, evaluation, and management decisions were developed solely by me, but detailed below in the PA's note. [facility] CARDIOLOGY CONSULT NOTE       Patient ID: Cole Collins MRN: [id number] DOB/AGE: 1950/06/25 71 y.o.  Admit date: 02/16/2022 Referring Physician Dr. Renae Gloss Primary Physician Dr. Letitia Libra Primary Cardiologist Previous Dr. Beatrix Fetters / Dr. Lady Gary Reason for Consultation new 2nd Degree Type 2 AV block   HPI: Cole Collins. Vivier is a 71yoM with a PMH of severe 3v CAD by [facility] 07/2020 for which referral to CT surgery for salvage CABG versus complex PCI was recommended, dilated cardiomyopathy (EF 35% 06/2021), type 2 diabetes, hypertension, hyperlipidemia, history of tobacco use who presented to [facility] ED 02/16/2022 with generalized weakness for several days saying his legs gave out on him, and a nonproductive cough that has since resolved.  He was found incidentally COVID-positive on presentation and has a new second-degree type II AV block on EKG for which cardiology is consulted for further assistance.  Interval history: -Intermittently in second-degree AV block type II (Mobitz) with rates in the mid 30s-40s on telemetry primarily overnight while resting, with periods of second-degree type I (Wenckebach) AV block with rates in the high 50s to low 60s with conversation and activity. -Continues to deny shortness of breath, chest pain, lightheadedness, dizziness, or other presyncopal symptoms. -Very eager to go home  Review of systems complete and found to be negative unless listed above     Past Medical History:  Diagnosis Date   CHF (congestive heart failure) (HCC)    Diabetes (HCC) 2019   Hypertension 2019    Past Surgical History:  Procedure Laterality Date   CATARACT EXTRACTION W/PHACO Right 03/04/2021   Procedure: CATARACT EXTRACTION PHACO AND INTRAOCULAR LENS PLACEMENT (IOC) RIGHT RAYNOR LENS  19.58 01:31.7;  Surgeon: Galen Manila, MD;  Location: [facility];  Service: Ophthalmology;  Laterality: Right;   CATARACT EXTRACTION W/PHACO Left 03/18/2021   Procedure: CATARACT EXTRACTION PHACO AND INTRAOCULAR LENS PLACEMENT (IOC) LEFT RAYNER LENS;  Surgeon: Galen Manila, MD;  Location: [facility];  Service: Ophthalmology;  Laterality: Left;  15.74 1:21.8   LEFT HEART CATH AND CORONARY ANGIOGRAPHY Bilateral 07/23/2020   Procedure: LEFT HEART CATH AND CORONARY ANGIOGRAPHY;  Surgeon: Marcina Millard, MD;  Location: [facility]VASIVE [facility];  Service: Cardiovascular;  Laterality: Bilateral;    Medications Prior to Admission  Medication Sig Dispense Refill Last Dose   aspirin 81 MG chewable tablet Chew by mouth.   Past Week   carvedilol (COREG) 12.5 MG tablet Take 12.5 mg by mouth 2 (two) times daily with a meal.   Past Week   ENTRESTO 49-51 MG SMARTSIG:1 Tablet(s) By Mouth Every 12 Hours   Past Week   furosemide (LASIX) 20 MG tablet Take 1 tablet (20 mg total) by mouth daily. (Patient taking differently: Take 40 mg by mouth 2 (two) times daily.) 30 tablet 1 Past Week   glipiZIDE (GLUCOTROL) 5 MG tablet Take 5 mg by mouth daily before breakfast.   Past Week   rosuvastatin (CRESTOR) 20 MG tablet Take 20 mg by mouth daily.   Past Week   traZODone (DESYREL) 50 MG tablet Take 50 mg by mouth at bedtime.   Past Week    Social History   Socioeconomic History   Marital status: Widowed    Spouse name: Not on file   Number of children: 2  Years of education: Not on file   Highest education level: Not on file  Occupational History   Occupation: Driver Ed Runner, broadcasting/film/video  Tobacco Use   Smoking status: Former    Packs/day: 0.50    Years: 50.00    Total pack years: 25.00    Types: Cigarettes    Quit date: 04/06/2020    Years since quitting: 1.8   Smokeless tobacco: Never  Vaping Use   Vaping Use: Never used  Substance and Sexual Activity   Alcohol use: Not Currently    Drug use: Never   Sexual activity: Not on file  Other Topics Concern   Not on file  Social History Narrative   Lives by himself.    Social Determinants of Health   Financial Resource Strain: Not on file  Food Insecurity: No Food Insecurity (02/17/2022)   Hunger Vital Sign    Worried About Running Out of Food in the Last Year: Never true    Ran Out of Food in the Last Year: Never true  Transportation Needs: No Transportation Needs (02/17/2022)   PRAPARE - Administrator, Civil Service (Medical): No    Lack of Transportation (Non-Medical): No  Physical Activity: Not on file  Stress: Not on file  Social Connections: Not on file  Intimate Partner Violence: Not At Risk (02/17/2022)   Humiliation, Afraid, Rape, and Kick questionnaire    Fear of Current or Ex-Partner: No    Emotionally Abused: No    Physically Abused: No    Sexually Abused: No    Family History  Problem Relation Age of Onset   Dementia Mother    Lymphoma Father      Vitals:   02/18/22 0801 02/18/22 1000 02/18/22 1005 02/18/22 1214  BP: (!) 91/46   133/75  Pulse: (!) 33   (!) 42  Resp: 18 (!) Temp: 98.8 F (37.1 C)   97.6 F (36.4 C)  TempSrc:    Oral  SpO2: 93%   98%  Weight:      Height:        PHYSICAL EXAM General: Elderly Caucasian male, well nourished, in no acute distress.  Sitting upright in recliner eating lunch HEENT:  Normocephalic and atraumatic. Neck:  No JVD.  Lungs: Normal respiratory effort on room air.  Decreased breath sounds with trace crackles. Heart: Bradycardic and irregular rhythm. Normal S1 and S2 without gallops or murmurs.  Abdomen: Non-distended appearing with excess adiposity.  Msk: Normal strength and tone for age. Extremities: Warm and well perfused. No clubbing, cyanosis.  No significant peripheral edema.  Neuro: Alert and oriented X 3. Psych:  Answers questions appropriately.   Labs: Basic Metabolic Panel: Recent Labs    02/15/22 2157  02/17/22 0631 02/18/22 0616  NA  --  137 137  K  --  3.8 3.2*  CL  --  107 105  CO2  --  20* 24  GLUCOSE  --  200* 147*  BUN  --  18 22  CREATININE  --  0.92 0.95  CALCIUM  --  8.9 8.3*  MG 2.0  --  2.3   Liver Function Tests: Recent Labs    02/17/22 0631 02/18/22 0616  AST 40 36  ALT 23 20  ALKPHOS 49 44  BILITOT 1.4* 1.3*  PROT 7.2 6.8  ALBUMIN 3.8 3.4*   No results for input(s): "LIPASE", "AMYLASE" in the last 72 hours. CBC: Recent Labs    02/17/22 0631 02/18/22 1610  WBC 5.7 6.5  NEUTROABS 4.1 4.7  HGB 15.8 16.1  HCT 46.9 47.6  MCV 89.8 90.0  PLT 162 151   Cardiac Enzymes: Recent Labs    02/15/22 2157 02/16/22 0123 02/16/22 0438 02/17/22 0631 02/18/22 0616  CKTOTAL 735*  --   --  579* 414*  TROPONINIHS 30* 27* 35*  --   --    BNP: Recent Labs    02/15/22 2157 02/16/22 0438  BNP 2,610.1* 2,214.0*   D-Dimer: Recent Labs    02/15/22 2157 02/17/22 0631  DDIMER 2.23* 0.99*   Hemoglobin A1C: Recent Labs    02/17/22 0631  HGBA1C 6.9*   Fasting Lipid Panel: No results for input(s): "CHOL", "HDL", "LDLCALC", "TRIG", "CHOLHDL", "LDLDIRECT" in the last 72 hours. Thyroid Function Tests: No results for input(s): "TSH", "T4TOTAL", "T3FREE", "THYROIDAB" in the last 72 hours.  Invalid input(s): "FREET3" Anemia Panel: Recent Labs    02/17/22 0631  FERRITIN 278     Radiology: CT Angio Chest Pulmonary Embolism (PE) W or WO Contrast  Result Date: 02/16/2022 CLINICAL DATA:  71 year old male with history of positive D-dimer. Suspected pulmonary embolism. EXAM: CT ANGIOGRAPHY CHEST WITH CONTRAST TECHNIQUE: Multidetector CT imaging of the chest was performed using the standard protocol during bolus administration of intravenous contrast. Multiplanar CT image reconstructions and MIPs were obtained to evaluate the vascular anatomy. RADIATION DOSE REDUCTION: This exam was performed according to the departmental dose-optimization program which includes  automated exposure control, adjustment of the mA and/or kV according to patient size and/or use of iterative reconstruction technique. CONTRAST:  75mL OMNIPAQUE IOHEXOL 350 MG/ML SOLN COMPARISON:  None Available. FINDINGS: Cardiovascular: No filling defects are noted within the pulmonary arterial tree to suggest pulmonary embolism. Heart size is mildly enlarged. There is no significant pericardial fluid, thickening or pericardial calcification. There is aortic atherosclerosis, as well as atherosclerosis of the great vessels of the mediastinum and the coronary arteries, including calcified atherosclerotic plaque in the left main, left anterior descending, left circumflex and right coronary arteries. Mediastinum/Nodes: No pathologically enlarged mediastinal or hilar lymph nodes. Esophagus is unremarkable in appearance. No axillary lymphadenopathy. Lungs/Pleura: Trace right pleural effusion lying dependently. No left pleural effusion. No suspicious appearing pulmonary nodules or masses are noted. No acute consolidative airspace disease. Upper Abdomen: Aortic atherosclerosis. Musculoskeletal: There are no aggressive appearing lytic or blastic lesions noted in the visualized portions of the skeleton. Review of the MIP images confirms the above findings. IMPRESSION: 1. No evidence of pulmonary embolism. 2. Trace right pleural effusion lying dependently. 3. Mild cardiomegaly. 4. Aortic atherosclerosis, in addition to left main and three-vessel coronary artery disease. Assessment for potential risk factor modification, dietary therapy or pharmacologic therapy may be warranted, if clinically indicated. Aortic Atherosclerosis (ICD10-I70.0). Electronically Signed   By: Trudie Reedaniel  Entrikin M.D.   On: 02/16/2022 05:05   DG Foot Complete Left  Result Date: 02/16/2022 CLINICAL DATA:  Fall EXAM: LEFT FOOT - COMPLETE 3+ VIEW COMPARISON:  None Available. FINDINGS: No acute bony abnormality. Specifically, no fracture, subluxation,  or dislocation. 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 Soft tissues are intact. IMPRESSION: No acute bony abnormality. Electronically Signed   By: Charlett NoseKevin  Dover M.D.   On: 02/16/2022 01:58   DG Ankle Complete Left  Result Date: 02/16/2022 CLINICAL DATA:  Fall EXAM: LEFT ANKLE COMPLETE - 3+ VIEW COMPARISON:  None Available. FINDINGS: Diffuse soft tissue swelling. Well corticated bone fragment adjacent to the lateral malleolus tip, likely related to old injury. No acute fracture, subluxation or  dislocation. Joint spaces maintained. IMPRESSION: No acute bony abnormality. Electronically Signed   By: Charlett Nose M.D.   On: 02/16/2022 01:57   DG Chest Portable 1 View  Result Date: 02/15/2022 CLINICAL DATA:  Weakness EXAM: PORTABLE CHEST 1 VIEW COMPARISON:  None Available. FINDINGS: The heart size and mediastinal contours are within normal limits. Both lungs are clear. The visualized skeletal structures are unremarkable. IMPRESSION: No active disease. Electronically Signed   By: Darliss Cheney M.D.   On: 02/15/2022 22:26    ECHO 06/16/2021 ECHOCARDIOGRAPHIC MEASUREMENTS  2D DIMENSIONS  AORTA                  Values   Normal Range   MAIN PA         Values    Normal Range                Annulus: nm*          [2.3-2.9]         PA Main: nm*       [1.5-2.1]              Aorta Sin: 3.4 cm       [3.1-3.7]    RIGHT VENTRICLE            ST Junction: nm*          [2.6-3.2]         RV Base: nm*       [<4.2]              Asc.Aorta: nm*          [2.6-3.4]          RV Mid: 3.0 cm    [<3.5]  LEFT VENTRICLE                                      RV Length: nm*       [<8.6]                  LVIDd: 6.4 cm       [4.2-5.9]    INFERIOR VENA CAVA                  LVIDs: 5.1 cm                        Max. IVC: nm*       [<=2.1]                     FS: 20.0 %       [>25]            Min. IVC: nm*                    SWT: 1.1 cm       [0.6-1.0]    ------------------                    PWT: 1.1 cm       [0.6-1.0]    nm* - not  measured  LEFT ATRIUM                LA Diam: 4.4 cm       [3.0-4.0]            LA A4C Area: nm*          [<20]  LA Volume: nm*          [18-58]  _________________________________________________________________________________________  ECHOCARDIOGRAPHIC DESCRIPTIONS  AORTIC ROOT                   Size: Normal             Dissection: INDETERM FOR DISSECTION  AORTIC VALVE               Leaflets: Tricuspid                   Morphology: MILDLY THICKENED               Mobility: Fully mobile  LEFT VENTRICLE                   Size: MILDLY ENLARGED               Anterior: HYPOCONTRACTILE            Contraction: REGIONALLY IMPAIRED            Lateral: HYPOCONTRACTILE             Closest EF: 35% (Estimated)                 Septal: HYPOCONTRACTILE              LV Masses: No Masses                       Apical: HYPOCONTRACTILE                    LVH: None                          Inferior: HYPOCONTRACTILE                                                      Posterior: HYPOCONTRACTILE           Dias.FxClass: (Grade 1) relaxation abnormal, E/A reversal  MITRAL VALVE               Leaflets: Normal                        Mobility: Fully mobile             Morphology: Normal  LEFT ATRIUM                   Size: MILDLY ENLARGED              LA Masses: No masses              IA Septum: Normal IAS  MAIN PA                   Size: Normal  PULMONIC VALVE             Morphology: Normal                        Mobility: Fully mobile  RIGHT VENTRICLE              RV Masses: No Masses  Size: Normal              Free Wall: Normal                     Contraction: Normal  TRICUSPID VALVE               Leaflets: Normal                        Mobility: Fully mobile             Morphology: Normal  RIGHT ATRIUM                   Size: Normal                        RA Other: None                RA Mass: No masses  PERICARDIUM                  Fluid: No effusion  INFERIOR  VENACAVA                   Size: Normal Normal respiratory collapse  _________________________________________________________________________________________   DOPPLER ECHO and OTHER SPECIAL PROCEDURES                 Aortic: MILD AR                    No AS                         140.5 cm/sec peak vel      7.9 mmHg peak grad                         4.2 mmHg mean grad         2.0 cm^2 by DOPPLER                 Mitral: MILD MR                    No MS                         MV Inflow E Vel = 53.6 cm/sec     MV Annulus E'Vel = 5.3 cm/sec                         E/E'Ratio = 10.1              Tricuspid: TRIVIAL TR                 No TS                         191.4 cm/sec peak TR vel   17.7 mmHg peak RV pressure              Pulmonary: TRIVIAL PR                 No PS  _________________________________________________________________________________________  INTERPRETATION  MODERATE LV SYSTOLIC DYSFUNCTION (See above)  NORMAL RIGHT VENTRICULAR SYSTOLIC FUNCTION  MILD VALVULAR REGURGITATION (See above)  NO VALVULAR STENOSIS  EF ~ 35%  _________________________________________________________________________________________  Electronically signed by  Donnelly Angelica, MD on 06/23/2021 12: 56 PM           Performed By: Johnathan Hausen, [place], RVT     Ordering Physician: Donnelly Angelica, MD   TELEMETRY reviewed by me (LT) 02/18/2022 : Intermittently in second-degree AV block type II (Mobitz) with rates in the mid 30s-40s on telemetry primarily overnight while resting, with periods of second-degree type I (Wenckebach) AV block with rates in the high 50s to low 60s with conversation and activity.  EKG reviewed by me: Mobitz type II second-degree AV block rate 40s  Data reviewed by me (LT) 02/18/2022: Hospitalist progress note, nursing notes, CBC, BMP, electrolytes, telemetry, vitals  Principal Problem:   Mobitz type 2 second degree heart block Active Problems:   COVID-19 virus infection    Hypokalemia   Chronic systolic CHF (congestive heart failure) ([place])   Uncontrolled type 2 diabetes mellitus with hyperglycemia, without long-term current use of insulin (HCC)   Elevated d-dimer   Dyslipidemia    ASSESSMENT AND PLAN:  Berish Bohman. Babich is a 55yoM with a PMH of severe 3v CAD by [facility] 07/2020 for which referral to CT surgery for salvage CABG versus complex PCI was recommended, dilated cardiomyopathy (EF 35% 06/2021), type 2 diabetes, hypertension, hyperlipidemia, history of tobacco use who presented to [facility] ED 02/16/2022 with generalized weakness times several days saying his legs gave out on him, and a nonproductive cough that has since resolved.  He was found incidentally COVID-positive on presentation and has a new second-degree type II AV block on EKG for which cardiology is consulted for further assistance.  #COVID-19 #Intermittent Mobitz type II second-degree AV block and Wenckebach type I second-degree AV block  Presents with a 3-day history of generalized weakness, with significant weakness in his legs and upper arms, unable to get off the floor on the evening of 10/14 after sliding off of his bed.  Incidentally COVID-positive on admission, had a productive cough for several days prior to presentation that is improved now.  Initial EKG shows a Mobitz type II second-degree AV block, with heart rate in the 40s, carvedilol has been held and his heart rate has improved to the high 40s to low 50s on telemetry throughout the day. -Agree with current therapy per primary team, supportive care for COVID-19 infection -Hold carvedilol and AV nodal blockers, do not restart at discharge -Monitor and replete electrolytes for a K >4, mag >2 -Continuous monitoring on telemetry -No indication for temporary transvenous pacemaker  or permanent at this time  -Recommended Holter monitor at discharge for further evaluation of arrhythmias, the patient declines.  He prefers to see how he feels after he  is treated for his COVID infection and follow-up with cardiology and his PCP outpatient before considering further diagnostic testing.  Discussed worrisome symptoms that would prompt return to the emergency department (dizziness, lightheadedness, syncope) -consider further evaluation of inducible arrhythmias on an outpatient basis with an ETT  -Okay for discharge from a cardiac standpoint today, he has a follow-up appointment with Dr. Nehemiah Massed on 11/8.  #Acute on chronic HFrEF (EF 35% 06/2021) BNP significantly elevated at 2600 on presentation, he has bibasilar crackles and is clinically volume overloaded on exam on admission.  Admits to eating fast food frequently.,  Volume status is improved on hospital day 2. -S/p p.o. Lasix 40 mg x 2 -S/p 40 mg IV Lasix twice daily x2 doses.  Will dose 40 mg IV Lasix x1 today. -Continue Entresto 24-26 mg twice daily with hold  parameters for hypotension. -Continue spironolactone 12.5mg  for GDMT, consider jardiance on an outpatient basis  -Hold beta-blocker as above -Strict I's/O -He remains hesitant regarding defibrillator for primary prevention.  #3v CAD #Elevated troponin Denies chest pain, troponins minimally elevated with a flat trend, most consistent with demand/supply mismatch and not ACS.  -Continue aspirin, simvastatin -discussed the importance of compliance with his medicines, including aspirin -Ongoing discussions on an outpatient basis regarding management of his severe three-vessel disease, remains hesitant to pursue further cardiac diagnostic testing..  This patient's plan of care was discussed and created with Dr. Juliann Pares and he is in agreement.  Signed: Rebeca Allegra , PA-C 02/18/2022, 1:22 PM [facility]

## 2022-02-18 NOTE — TOC CM/SW Note (Signed)
  Transition of Care [facility]) Screening Note   Patient Details  Name: Cole Collins Date of Birth: Feb 27, 1951   Transition of Care [facility]) CM/SW Contact:    Candie Chroman, LCSW Phone Number: 02/18/2022, 9:36 AM    Transition of Care Department [facility]) has reviewed patient and no TOC needs have been identified at this time. We will continue to monitor patient advancement through interdisciplinary progression rounds. If new patient transition needs arise, please place a TOC consult.

## 2022-02-18 NOTE — Discharge Summary (Signed)
Physician Discharge Summary  Cole Collins VOH:[id number] DOB: 1950-09-01 DOA: 02/16/2022  PCP: Gracelyn Nurse, MD  Admit date: 02/16/2022 Discharge date: 02/18/2022  Admitted From: Home Disposition:  Home  Recommendations for Outpatient Follow-up:  Follow up with PCP in 1-2 weeks Follow up cardiology Dr. Gwen Pounds as directed  Home Health:No  Equipment/Devices:None   Discharge Condition:Stable  CODE STATUS:FULL  Diet recommendation: Carb mod  Brief/Interim Summary: Cole Collins is a 71 y.o. Caucasian male with medical history significant for systolic CHF with EF of 30 to 69%, type II diabetes mellitus and hypertension, who presented to the emergency room with onset of generalized weakness for the last few days with associated cough productive of clear sputum.  His legs gave out from underneath him on Saturday and he fell to the ground without head injuries or loss of consciousness.  He stated that he injured his left ankle as he felt a "pop".  X-rays were negative.  Patient was found to have COVID-positive infection.  Paxlovid started.  Patient also had bradycardia where his Coreg was stopped.  Initially had Wenckebach heart block but then converted to second-degree heart block type II.  Cardiology was consulted.  Cardiology just wanted to watch things.  Patient feeling better on 02/17/2022.  Discussed with cardiology on 10/18.  Patient still having intermittent episodes of Mobitz type II second-degree heart block.  Essentially asymptomatic.  Offered Holter monitor but patient declining at this time.  Cleared for discharge.  At time of discharge we will discontinue Coreg.  Reduce Entresto to half dose of 24-26.  Okay to resume Lasix 40 mg p.o. twice daily.  Added Aldactone 12.5 mg daily.  Added Paxlovid 3 tabs twice daily for remainder of 3 days to complete 5-day total course.  Stable for DC at this time.  Follow-up outpatient PCP and cardiology.    Discharge Diagnoses:  Principal  Problem:   Mobitz type 2 second degree heart block Active Problems:   COVID-19 virus infection   Hypokalemia   Elevated d-dimer   Chronic systolic CHF (congestive heart failure) (HCC)   Uncontrolled type 2 diabetes mellitus with hyperglycemia, without long-term current use of insulin (HCC)   Dyslipidemia  * Mobitz type 2 second degree heart block Appreciate cardiology consultation.  Coreg held at time of discharge.  Patient still having intermittent episodes of Mobitz type II second-degree heart block.  Offered Holter monitor, declining at time of DC.  We will follow-up with cardiology within a week of discharge.    COVID-19 virus infection Continue Paxlovid.  Remaining 3 days a 5-day course of been prescribed   Hypokalemia Replaced   Elevated d-dimer CT scan of the chest negative for pulmonary embolism.  D-dimer trended down likely elevated with COVID infection.   Chronic systolic CHF (congestive heart failure) (HCC) Continue Entresto and Lasix.  Holding Coreg with type II heart block.   Dyslipidemia Hold Crestor with elevated CPK and being on Paxlovid.   Uncontrolled type 2 diabetes mellitus with hyperglycemia, without long-term current use of insulin (HCC) Patient on glipizide.  Can remove at time of discharge  Chronic systolic congestive heart failure Last ejection fraction 35%.  Seen in consultation by cardiology during admission.  At time of discharge will resume Lasix 40 mg p.o. twice daily.  Added Aldactone 12.5 mg daily.  Follow-up with cardiology within a week of DC.  Established with Dr. Gwen Pounds from [facility].  Discharge Instructions  Discharge Instructions     Diet - low sodium heart  healthy   Complete by: As directed    Increase activity slowly   Complete by: As directed       Allergies as of 02/18/2022   No Known Allergies      Medication List     STOP taking these medications    carvedilol 12.5 MG tablet Commonly known as: COREG        TAKE these medications    aspirin 81 MG chewable tablet Chew by mouth.   Entresto 24-26 MG Generic drug: sacubitril-valsartan Take 1 tablet by mouth 2 (two) times daily. What changed: Another medication with the same name was removed. Continue taking this medication, and follow the directions you see here.   furosemide 20 MG tablet Commonly known as: LASIX Take 1 tablet (20 mg total) by mouth daily. What changed:  how much to take when to take this   glipiZIDE 5 MG tablet Commonly known as: GLUCOTROL Take 5 mg by mouth daily before breakfast.   nirmatrelvir/ritonavir EUA 20 x 150 MG & 10 x 100MG  Tabs Commonly known as: PAXLOVID Take 3 tablets by mouth 2 (two) times daily for 3 days. Patient GFR is 60. Take nirmatrelvir (150 mg) two tablets twice daily for 5 days and ritonavir (100 mg) one tablet twice daily for 5 days. Start taking on: February 19, 2022   rosuvastatin 20 MG tablet Commonly known as: CRESTOR Take 20 mg by mouth daily.   spironolactone 25 MG tablet Commonly known as: ALDACTONE Take 0.5 tablets (12.5 mg total) by mouth daily. Start taking on: February 19, 2022   traZODone 50 MG tablet Commonly known as: DESYREL Take 50 mg by mouth at bedtime.        Follow-up Information     February 21, 2022, MD. Go in 1 week(s).   Specialty: Cardiology Why: has appt 11/8 Contact information: [address] [facility] [place] [phone]         [phone], MD Follow up.   Specialty: Internal Medicine Contact information: [address] [place] [phone]                No Known Allergies  Consultations: Cardiology-[facility]   Procedures/Studies: CT Angio Chest Pulmonary Embolism (PE) W or WO Contrast  Result Date: 02/16/2022 CLINICAL DATA:  71 year old male with history of positive D-dimer. Suspected pulmonary embolism. EXAM: CT ANGIOGRAPHY CHEST WITH  CONTRAST TECHNIQUE: Multidetector CT imaging of the chest was performed using the standard protocol during bolus administration of intravenous contrast. Multiplanar CT image reconstructions and MIPs were obtained to evaluate the vascular anatomy. RADIATION DOSE REDUCTION: This exam was performed according to the departmental dose-optimization program which includes automated exposure control, adjustment of the mA and/or kV according to patient size and/or use of iterative reconstruction technique. CONTRAST:  16mL OMNIPAQUE IOHEXOL 350 MG/ML SOLN COMPARISON:  None Available. FINDINGS: Cardiovascular: No filling defects are noted within the pulmonary arterial tree to suggest pulmonary embolism. Heart size is mildly enlarged. There is no significant pericardial fluid, thickening or pericardial calcification. There is aortic atherosclerosis, as well as atherosclerosis of the great vessels of the mediastinum and the coronary arteries, including calcified atherosclerotic plaque in the left main, left anterior descending, left circumflex and right coronary arteries. Mediastinum/Nodes: No pathologically enlarged mediastinal or hilar lymph nodes. Esophagus is unremarkable in appearance. No axillary lymphadenopathy. Lungs/Pleura: Trace right pleural effusion lying dependently. No left pleural effusion. No suspicious appearing pulmonary nodules or masses are noted. No acute  consolidative airspace disease. Upper Abdomen: Aortic atherosclerosis. Musculoskeletal: There are no aggressive appearing lytic or blastic lesions noted in the visualized portions of the skeleton. Review of the MIP images confirms the above findings. IMPRESSION: 1. No evidence of pulmonary embolism. 2. Trace right pleural effusion lying dependently. 3. Mild cardiomegaly. 4. Aortic atherosclerosis, in addition to left main and three-vessel coronary artery disease. Assessment for potential risk factor modification, dietary therapy or pharmacologic therapy may  be warranted, if clinically indicated. Aortic Atherosclerosis (ICD10-I70.0). Electronically Signed   By: Trudie Reedaniel  Entrikin M.D.   On: 02/16/2022 05:05   DG Foot Complete Left  Result Date: 02/16/2022 CLINICAL DATA:  Fall EXAM: LEFT FOOT - COMPLETE 3+ VIEW COMPARISON:  None Available. FINDINGS: No acute bony abnormality. Specifically, no fracture, subluxation, or dislocation. 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 Soft tissues are intact. IMPRESSION: No acute bony abnormality. Electronically Signed   By: Charlett NoseKevin  Dover M.D.   On: 02/16/2022 01:58   DG Ankle Complete Left  Result Date: 02/16/2022 CLINICAL DATA:  Fall EXAM: LEFT ANKLE COMPLETE - 3+ VIEW COMPARISON:  None Available. FINDINGS: Diffuse soft tissue swelling. Well corticated bone fragment adjacent to the lateral malleolus tip, likely related to old injury. No acute fracture, subluxation or dislocation. Joint spaces maintained. IMPRESSION: No acute bony abnormality. Electronically Signed   By: Charlett NoseKevin  Dover M.D.   On: 02/16/2022 01:57   DG Chest Portable 1 View  Result Date: 02/15/2022 CLINICAL DATA:  Weakness EXAM: PORTABLE CHEST 1 VIEW COMPARISON:  None Available. FINDINGS: The heart size and mediastinal contours are within normal limits. Both lungs are clear. The visualized skeletal structures are unremarkable. IMPRESSION: No active disease. Electronically Signed   By: Darliss CheneyAmy  Guttmann M.D.   On: 02/15/2022 22:26      Subjective: Seen and examined at time of discharge.  Stable no distress.  No respiratory symptoms.  On room air.  Afebrile.  Stable for DC.  Discharge Exam: Vitals:   02/18/22 1005 02/18/22 1214  BP:  133/75  Pulse:  (!) 42  Resp: 19 14  Temp:  97.6 F (36.4 C)  SpO2:  98%   Vitals:   02/18/22 0801 02/18/22 1000 02/18/22 1005 02/18/22 1214  BP: (!) 91/46   133/75  Pulse: (!) 33   (!) 42  Resp: 18 (!) 23 19 14   Temp: 98.8 F (37.1 C)   97.6 F (36.4 C)  TempSrc:    Oral  SpO2: 93%   98%  Weight:       Height:        General: Pt is alert, awake, not in acute distress Cardiovascular: RRR, S1/S2 +, no rubs, no gallops Respiratory: CTA bilaterally, no wheezing, no rhonchi Abdominal: Soft, NT, ND, bowel sounds + Extremities: no edema, no cyanosis    The results of significant diagnostics from this hospitalization (including imaging, microbiology, ancillary and laboratory) are listed below for reference.     Microbiology: Recent Results (from the past 240 hour(s))  Resp Panel by RT-PCR (Flu A&B, Covid) Anterior Nasal Swab     Status: Abnormal   Collection Time: 02/15/22  9:57 PM   Specimen: Anterior Nasal Swab  Result Value Ref Range Status   SARS Coronavirus 2 by RT PCR POSITIVE (A) NEGATIVE Final    Comment: (NOTE) SARS-CoV-2 target nucleic acids are DETECTED.  The SARS-CoV-2 RNA is generally detectable in upper respiratory specimens during the acute phase of infection. Positive results are indicative of the presence of the identified virus, but  do not rule out bacterial infection or co-infection with other pathogens not detected by the test. Clinical correlation with patient history and other diagnostic information is necessary to determine patient infection status. The expected result is Negative.  Fact Sheet for Patients: BloggerCourse.com  Fact Sheet for Healthcare Providers: SeriousBroker.it  This test is not yet approved or cleared by the [place] FDA and  has been authorized for detection and/or diagnosis of SARS-CoV-2 by FDA under an Emergency Use Authorization (EUA).  This EUA will remain in effect (meaning this test can be used) for the duration of  the COVID-19 declaration under Section 564(b)(1) of the A ct, 21 U.S.C. section 360bbb-3(b)(1), unless the authorization is terminated or revoked sooner.     Influenza A by PCR NEGATIVE NEGATIVE Final   Influenza B by PCR NEGATIVE NEGATIVE Final    Comment:  (NOTE) The Xpert Xpress SARS-CoV-2/FLU/RSV plus assay is intended as an aid in the diagnosis of influenza from Nasopharyngeal swab specimens and should not be used as a sole basis for treatment. Nasal washings and aspirates are unacceptable for Xpert Xpress SARS-CoV-2/FLU/RSV testing.  Fact Sheet for Patients: BloggerCourse.com  Fact Sheet for Healthcare Providers: SeriousBroker.it  This test is not yet approved or cleared by the [place] FDA and has been authorized for detection and/or diagnosis of SARS-CoV-2 by FDA under an Emergency Use Authorization (EUA). This EUA will remain in effect (meaning this test can be used) for the duration of the COVID-19 declaration under Section 564(b)(1) of the Act, 21 U.S.C. section 360bbb-3(b)(1), unless the authorization is terminated or revoked.  Performed at [facility], [address] [place], [place] [address]      Labs: BNP (last 3 results) Recent Labs    02/15/22 2157 02/16/22 0438  BNP 2,610.1* 2,214.0*   Basic Metabolic Panel: Recent Labs  Lab 02/15/22 2153 02/15/22 2157 02/17/22 0631 02/18/22 0616  NA 139  --  137 137  K 3.3*  --  3.8 3.2*  CL 103  --  107 105  CO2 25  --  20* 24  GLUCOSE 107*  --  200* 147*  BUN 18  --  18 22  CREATININE 1.14  --  0.92 0.95  CALCIUM 9.2  --  8.9 8.3*  MG  --  2.0  --  2.3   Liver Function Tests: Recent Labs  Lab 02/15/22 2153 02/17/22 0631 02/18/22 0616  AST 47* 40 36  ALT 26 23 20   ALKPHOS 60 49 44  BILITOT 1.4* 1.4* 1.3*  PROT 8.2* 7.2 6.8  ALBUMIN 4.4 3.8 3.4*   No results for input(s): "LIPASE", "AMYLASE" in the last 168 hours. No results for input(s): "AMMONIA" in the last 168 hours. CBC: Recent Labs  Lab 02/15/22 2153 02/17/22 0631 02/18/22 0616  WBC 6.9 5.7 6.5  NEUTROABS 4.7 4.1 4.7  HGB 16.3 15.8 16.1  HCT 49.3 46.9 47.6  MCV 91.3 89.8 90.0  PLT 167 162 151   Cardiac Enzymes: Recent  Labs  Lab 02/15/22 2157 02/17/22 0631 02/18/22 0616  CKTOTAL 735* 579* 414*   BNP: Invalid input(s): "POCBNP" CBG: Recent Labs  Lab 02/16/22 0815 02/17/22 1714 02/17/22 2127 02/18/22 0754 02/18/22 1215  GLUCAP 131* 225* 260* 142* 193*   D-Dimer Recent Labs    02/15/22 2157 02/17/22 0631  DDIMER 2.23* 0.99*   Hgb A1c Recent Labs    02/17/22 0631  HGBA1C 6.9*   Lipid Profile No results for input(s): "CHOL", "HDL", "LDLCALC", "TRIG", "CHOLHDL", "LDLDIRECT"  in the last 72 hours. Thyroid function studies No results for input(s): "TSH", "T4TOTAL", "T3FREE", "THYROIDAB" in the last 72 hours.  Invalid input(s): "FREET3" Anemia work up Recent Labs    02/15/22 2157 02/17/22 0631  FERRITIN 247 278   Urinalysis    Component Value Date/Time   COLORURINE AMBER (A) 02/15/2022 2313   APPEARANCEUR HAZY (A) 02/15/2022 2313   LABSPEC 1.027 02/15/2022 2313   PHURINE 5.0 02/15/2022 2313   GLUCOSEU NEGATIVE 02/15/2022 2313   HGBUR MODERATE (A) 02/15/2022 2313   BILIRUBINUR NEGATIVE 02/15/2022 2313   KETONESUR 20 (A) 02/15/2022 2313   PROTEINUR >=300 (A) 02/15/2022 2313   NITRITE NEGATIVE 02/15/2022 2313   LEUKOCYTESUR NEGATIVE 02/15/2022 2313   Sepsis Labs Recent Labs  Lab 02/15/22 2153 02/17/22 0631 02/18/22 0616  WBC 6.9 5.7 6.5   Microbiology Recent Results (from the past 240 hour(s))  Resp Panel by RT-PCR (Flu A&B, Covid) Anterior Nasal Swab     Status: Abnormal   Collection Time: 02/15/22  9:57 PM   Specimen: Anterior Nasal Swab  Result Value Ref Range Status   SARS Coronavirus 2 by RT PCR POSITIVE (A) NEGATIVE Final    Comment: (NOTE) SARS-CoV-2 target nucleic acids are DETECTED.  The SARS-CoV-2 RNA is generally detectable in upper respiratory specimens during the acute phase of infection. Positive results are indicative of the presence of the identified virus, but do not rule out bacterial infection or co-infection with other pathogens not detected  by the test. Clinical correlation with patient history and other diagnostic information is necessary to determine patient infection status. The expected result is Negative.  Fact Sheet for Patients: EntrepreneurPulse.com.au  Fact Sheet for Healthcare Providers: IncredibleEmployment.be  This test is not yet approved or cleared by the [place] FDA and  has been authorized for detection and/or diagnosis of SARS-CoV-2 by FDA under an Emergency Use Authorization (EUA).  This EUA will remain in effect (meaning this test can be used) for the duration of  the COVID-19 declaration under Section 564(b)(1) of the A ct, 21 U.S.C. section 360bbb-3(b)(1), unless the authorization is terminated or revoked sooner.     Influenza A by PCR NEGATIVE NEGATIVE Final   Influenza B by PCR NEGATIVE NEGATIVE Final    Comment: (NOTE) The Xpert Xpress SARS-CoV-2/FLU/RSV plus assay is intended as an aid in the diagnosis of influenza from Nasopharyngeal swab specimens and should not be used as a sole basis for treatment. Nasal washings and aspirates are unacceptable for Xpert Xpress SARS-CoV-2/FLU/RSV testing.  Fact Sheet for Patients: EntrepreneurPulse.com.au  Fact Sheet for Healthcare Providers: IncredibleEmployment.be  This test is not yet approved or cleared by the [place] FDA and has been authorized for detection and/or diagnosis of SARS-CoV-2 by FDA under an Emergency Use Authorization (EUA). This EUA will remain in effect (meaning this test can be used) for the duration of the COVID-19 declaration under Section 564(b)(1) of the Act, 21 U.S.C. section 360bbb-3(b)(1), unless the authorization is terminated or revoked.  Performed at [facility], [address], [place], [place] [address]      Time coordinating discharge: Over 30 minutes  SIGNED:   Sidney Ace, MD  [facility] 02/18/2022, 2:40 PM Pager   If 7PM-7AM, please contact night-coverage

## 2022-02-18 NOTE — Inpatient Diabetes Management (Signed)
Inpatient Diabetes Program Recommendations  AACE/ADA: New Consensus Statement on Inpatient Glycemic Control   Target Ranges:  Prepandial:   less than 140 mg/dL      Peak postprandial:   less than 180 mg/dL (1-2 hours)      Critically ill patients:  140 - 180 mg/dL    Latest Reference Range & Units 02/16/22 08:15 02/17/22 17:14 02/17/22 21:27 02/18/22 07:54  Glucose-Capillary 70 - 99 mg/dL 131 (H) 225 (H) 260 (H) 142 (H)   Review of Glycemic Control  Diabetes history: DM2 Outpatient Diabetes medications: Glipizide 5 mg QAM Current orders for Inpatient glycemic control: Glipizide 5 mg QAM  Inpatient Diabetes Program Recommendations:    Insulin: While inpatient, please consider ordering CBGs AC&HS with Novolog 0-9 units TID with meals and Novolog 0-5 units QHS.  Thanks, Barnie Alderman, RN, MSN, [facility] Coordinator Inpatient Diabetes Program [phone] (Team Pager from 8am to Terry)

## 2022-10-20 IMAGING — CR DG CHEST 2V
1 series · 2 of 2 positions shown · non-contrast
Comparison: None.

CLINICAL DATA: Shortness of breath.

EXAM:
CHEST - 2 VIEW

[Series 1: dg chest 2 view · 0.14mm/px · 2 of 2 slices shown]
[im 1/2]
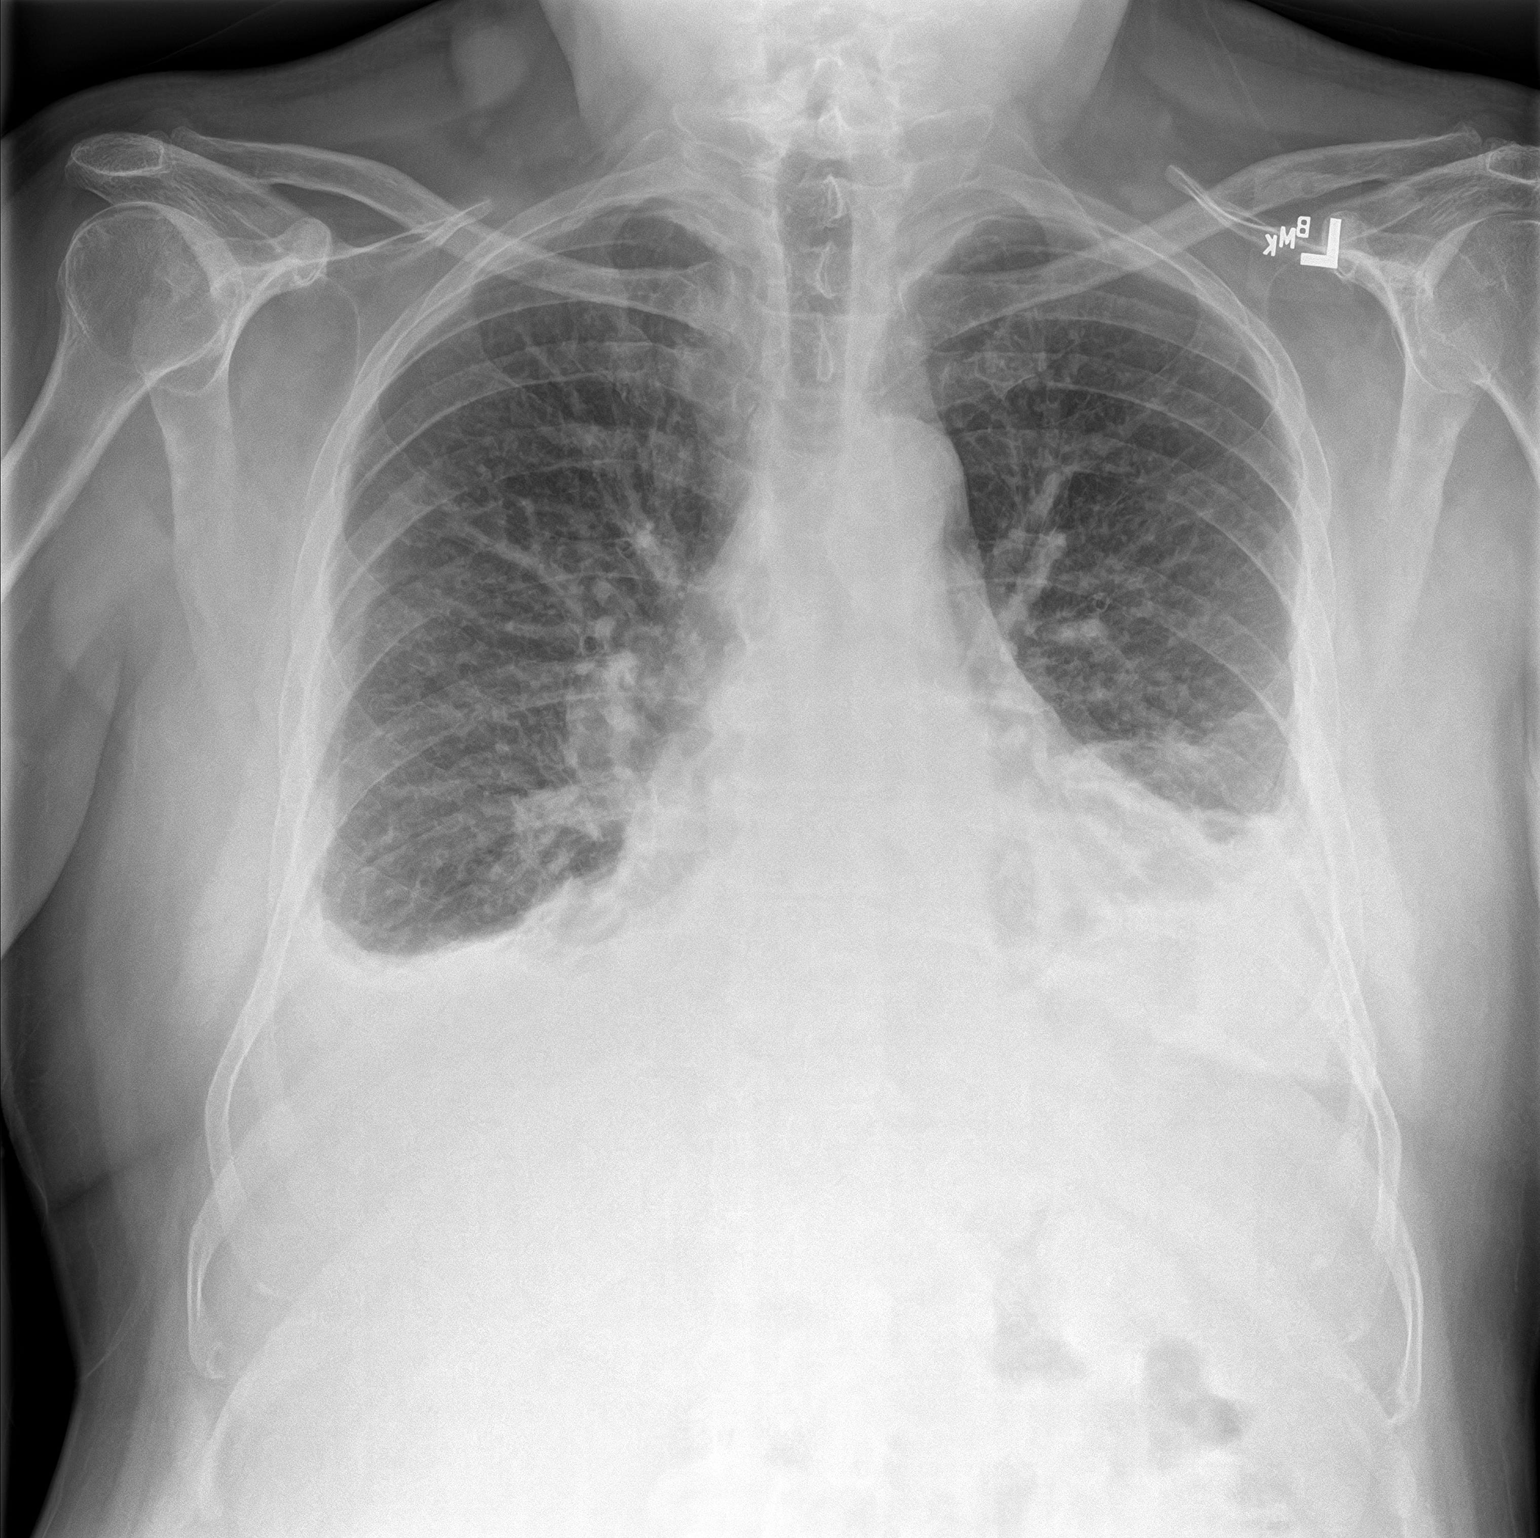
[im 2/2]
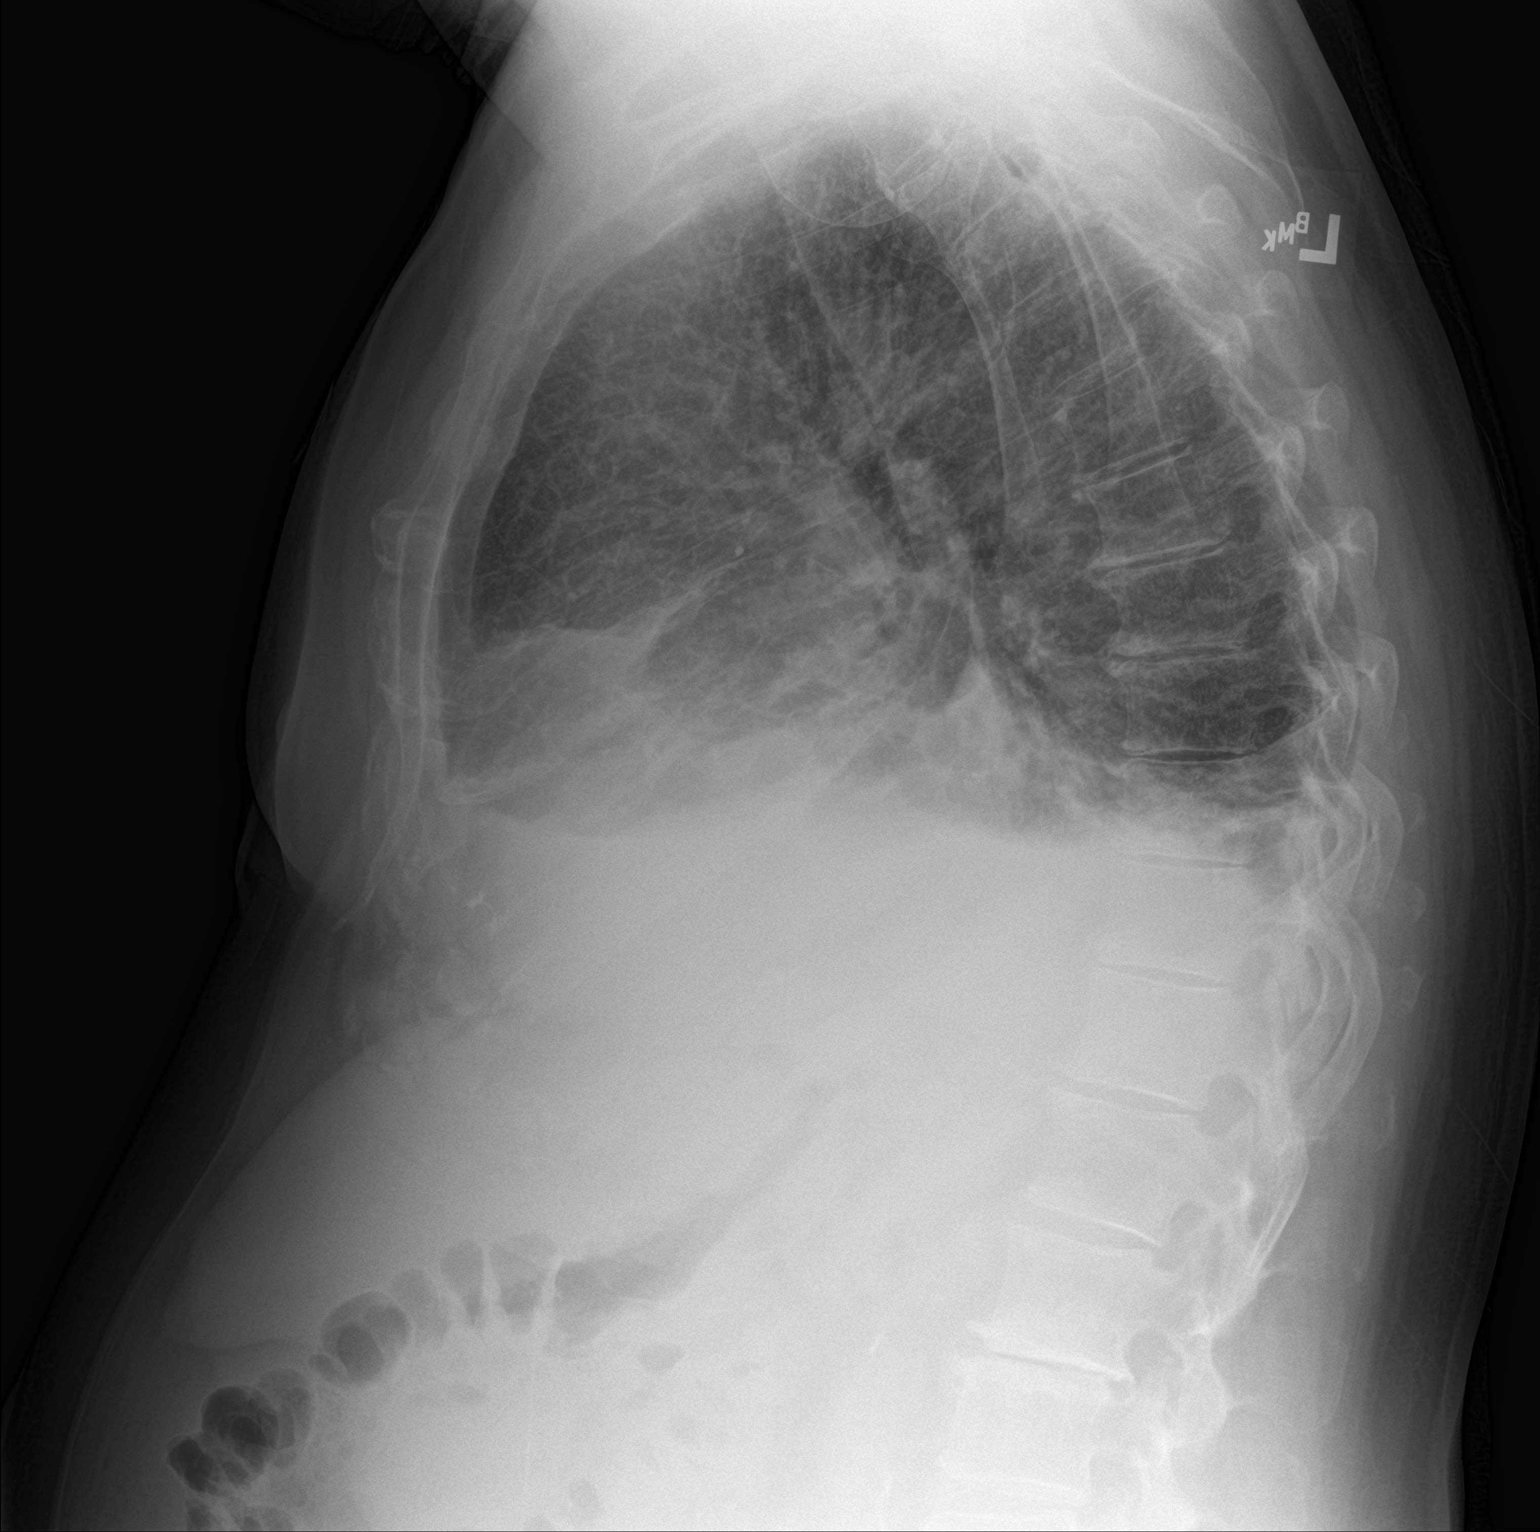

[2 of 2 positions shown; findings below may reference images not displayed]

FINDINGS: Moderate severity diffusely increased interstitial lung markings are
seen with mild prominence of the perihilar pulmonary vasculature.
Mild to moderate severity atelectasis and/or infiltrate is seen
within the left lung base. Small bilateral pleural effusions are
seen. No pneumothorax is identified. The heart size and mediastinal
contours are within normal limits. The visualized skeletal
structures are unremarkable.
IMPRESSION: 1. Moderate severity interstitial edema with mild to moderate
severity left basilar atelectasis and/or infiltrate.
2. Small bilateral pleural effusions.
